# Patient Record
Sex: Female | Born: 1980
Health system: Southern US, Community
[De-identification: ages and names within clinical notes are randomized; demographics above are authoritative.]

## PROBLEM LIST (undated history)

## (undated) ENCOUNTER — Inpatient Hospital Stay (HOSPITAL_COMMUNITY): Payer: Self-pay

## (undated) ENCOUNTER — Inpatient Hospital Stay (HOSPITAL_COMMUNITY): Payer: Self-pay | Admitting: Obstetrics and Gynecology

## (undated) DIAGNOSIS — I10 Essential (primary) hypertension: Secondary | ICD-10-CM

## (undated) DIAGNOSIS — D649 Anemia, unspecified: Secondary | ICD-10-CM

## (undated) DIAGNOSIS — IMO0002 Reserved for concepts with insufficient information to code with codable children: Secondary | ICD-10-CM

## (undated) DIAGNOSIS — N970 Female infertility associated with anovulation: Secondary | ICD-10-CM

## (undated) DIAGNOSIS — G43909 Migraine, unspecified, not intractable, without status migrainosus: Secondary | ICD-10-CM

## (undated) DIAGNOSIS — G510 Bell's palsy: Secondary | ICD-10-CM

## (undated) DIAGNOSIS — R87619 Unspecified abnormal cytological findings in specimens from cervix uteri: Secondary | ICD-10-CM

## (undated) DIAGNOSIS — Z8619 Personal history of other infectious and parasitic diseases: Secondary | ICD-10-CM

## (undated) DIAGNOSIS — O139 Gestational [pregnancy-induced] hypertension without significant proteinuria, unspecified trimester: Secondary | ICD-10-CM

## (undated) HISTORY — PX: COLPOSCOPY: SHX161

## (undated) HISTORY — PX: WISDOM TOOTH EXTRACTION: SHX21

## (undated) HISTORY — DX: Female infertility associated with anovulation: N97.0

## (undated) HISTORY — DX: Personal history of other infectious and parasitic diseases: Z86.19

## (undated) HISTORY — DX: Gestational (pregnancy-induced) hypertension without significant proteinuria, unspecified trimester: O13.9

## (undated) HISTORY — DX: Essential (primary) hypertension: I10

---

## 2004-07-30 ENCOUNTER — Other Ambulatory Visit: Admission: RE | Admit: 2004-07-30 | Discharge: 2004-07-30 | Payer: Self-pay | Admitting: Obstetrics and Gynecology

## 2008-10-31 ENCOUNTER — Inpatient Hospital Stay (HOSPITAL_COMMUNITY): Admission: AD | Admit: 2008-10-31 | Discharge: 2008-10-31 | Payer: Self-pay | Admitting: Obstetrics and Gynecology

## 2008-11-10 ENCOUNTER — Inpatient Hospital Stay (HOSPITAL_COMMUNITY): Admission: AD | Admit: 2008-11-10 | Discharge: 2008-11-13 | Payer: Self-pay | Admitting: Obstetrics and Gynecology

## 2008-11-14 ENCOUNTER — Encounter: Admission: RE | Admit: 2008-11-14 | Discharge: 2008-12-13 | Payer: Self-pay | Admitting: Certified Nurse Midwife

## 2011-09-20 LAB — COMPREHENSIVE METABOLIC PANEL
ALT: 16
ALT: 19
AST: 22
AST: 24
CO2: 23
CO2: 24
Calcium: 9.1
Chloride: 105
Creatinine, Ser: 0.48
GFR calc Af Amer: >60
GFR calc Af Amer: >60
GFR calc non Af Amer: >60
GFR calc non Af Amer: >60
Glucose, Bld: 95
Sodium: 135
Sodium: 138
Total Bilirubin: 0.9
Total Protein: 6

## 2011-09-20 LAB — CBC
HCT: 38.8
Hemoglobin: 12.3
Hemoglobin: 9.1 — ABNORMAL LOW
MCHC: 33.9
MCHC: 34.3
MCHC: 34.3
MCV: 100.6 — ABNORMAL HIGH
MCV: 99.2
MCV: 99.8
Platelets: 210
Platelets: 225
RBC: 2.6 — ABNORMAL LOW
RBC: 3.58 — ABNORMAL LOW
RDW: 13.4
RDW: 13.7
RDW: 13.7
RDW: 14
WBC: 12.3 — ABNORMAL HIGH
WBC: 12.9 — ABNORMAL HIGH
WBC: 13.6 — ABNORMAL HIGH

## 2011-09-20 LAB — LACTATE DEHYDROGENASE
LDH: 173
LDH: 176

## 2012-04-01 ENCOUNTER — Emergency Department (HOSPITAL_COMMUNITY)
Admission: EM | Admit: 2012-04-01 | Discharge: 2012-04-01 | Disposition: A | Discharge status: Home / Self Care | Payer: 59 | Source: Home / Self Care | Attending: Emergency Medicine | Admitting: Emergency Medicine

## 2012-04-01 ENCOUNTER — Encounter (HOSPITAL_COMMUNITY): Payer: Self-pay | Admitting: *Deleted

## 2012-04-01 DIAGNOSIS — J329 Chronic sinusitis, unspecified: Secondary | ICD-10-CM

## 2012-04-01 MED ORDER — ONDANSETRON 8 MG PO TBDP
ORAL_TABLET | ORAL | Status: AC
Start: 2012-04-01 — End: 2012-04-08

## 2012-04-01 MED ORDER — IBUPROFEN 600 MG PO TABS: 600.0000 mg | ORAL_TABLET | Freq: Four times a day (QID) | ORAL | Status: AC | PRN

## 2012-04-01 MED ORDER — FLUTICASONE PROPIONATE 50 MCG/ACT NA SUSP: 2.0000 | Freq: Every day | NASAL | Status: DC

## 2012-04-01 MED ORDER — PSEUDOEPHEDRINE-GUAIFENESIN ER 120-1200 MG PO TB12: 1.0000 | ORAL_TABLET | Freq: Two times a day (BID) | ORAL | Status: DC

## 2012-04-01 NOTE — ED Provider Notes (Signed)
History     CSN: [id number]  Arrival date & time 04/01/12  4098   First MD Initiated Contact with Patient 04/01/12 [phone]      Chief Complaint  Patient presents with  . Fever  . Generalized Body Aches  . Nasal Congestion  . Headache  . Ear Fullness  . Nausea    (Consider location/radiation/quality/duration/timing/severity/associated sxs/prior treatment) HPI Comments: Pt with nasal congestion, postnasal drip,  bilateral ear fullness, bilateral rontal sinus pain/pressure worse with bending forward/lying down x 3 days.  reports fevers Tmax 101.5, bodyaches. States that her upper teeth hurt. Reports nausea, no vomiting. No other sore throat, HA,  purulent nasal d/c. Taking  Tylenol cold sinus with temporary relief. No itchy, watery eyes. Patient's son currently has gastroenteritis, but she states her symptoms are different. No known sick contacts.     Patient is a 31 y.o. female presenting with fever, headaches, and plugged ear sensation.  Fever Primary symptoms of the febrile illness include fever, headaches and nausea. The current episode started 3 to 5 days ago. This is a new problem. The problem has not changed since onset. Headache The primary symptoms include headaches, fever and nausea.  Additional symptoms do not include tinnitus.  Ear Fullness Associated symptoms include headaches.    History reviewed. No pertinent past medical history.  History reviewed. No pertinent past surgical history.  History reviewed. No pertinent family history.  History  Substance Use Topics  . Smoking status: Never Smoker   . Smokeless tobacco: Not on file  . Alcohol Use: No    OB History    Grav Para Term Preterm Abortions TAB SAB Ect Mult Living                  Review of Systems  Constitutional: Positive for fever.  HENT: Positive for ear pain. Negative for sneezing, drooling and tinnitus.   Gastrointestinal: Positive for nausea.  Genitourinary: Negative.   Musculoskeletal:  Negative for back pain.  Neurological: Positive for headaches.    Allergies  Review of patient's allergies indicates no known allergies.  Home Medications   Current Outpatient Rx  Name Route Sig Dispense Refill  . PRESCRIPTION MEDICATION  Birth control pill    . FLUTICASONE PROPIONATE 50 MCG/ACT NA SUSP Nasal Place 2 sprays into the nose daily. 16 g 0  . IBUPROFEN 600 MG PO TABS Oral Take 1 tablet (600 mg total) by mouth every 6 (six) hours as needed for pain. 30 tablet 0  . ONDANSETRON 8 MG PO TBDP  1/2- 1 tablet q 8 hr prn nausea, vomiting 20 tablet 0  . PSEUDOEPHEDRINE-GUAIFENESIN ER [phone] MG PO TB12 Oral Take 1 tablet by mouth 2 (two) times daily. 20 each 0    BP 117/81  Pulse 89  Temp(Src) 99.2 F (37.3 C) (Oral)  Resp 18  SpO2 98%  LMP 03/07/2012  Physical Exam  Nursing note and vitals reviewed. Constitutional: She is oriented to person, place, and time. She appears well-developed and well-nourished.  HENT:  Head: Normocephalic and atraumatic.  Right Ear: Tympanic membrane and ear canal normal.  Left Ear: Tympanic membrane and ear canal normal.  Nose: Mucosal edema and rhinorrhea present. No epistaxis. Right sinus exhibits no frontal sinus tenderness. Left sinus exhibits no frontal sinus tenderness.  Mouth/Throat: Uvula is midline, oropharynx is clear and moist and mucous membranes are normal. Normal dentition. No dental caries.       (+)  maxillary sinus tenderness. No purulent nasal discharge.  Eyes:  Conjunctivae and EOM are normal. Pupils are equal, round, and reactive to light.  Neck: Normal range of motion. Neck supple.  Cardiovascular: Normal rate, regular rhythm and normal heart sounds.   Pulmonary/Chest: Effort normal and breath sounds normal. She has no rales.  Abdominal: Bowel sounds are normal. She exhibits no distension.  Musculoskeletal: Normal range of motion.  Lymphadenopathy:       Shotty cervical lymphadenopathy bilaterally  Neurological: She is  alert and oriented to person, place, and time.  Skin: Skin is warm and dry. No rash noted.  Psychiatric: She has a normal mood and affect. Her behavior is normal. Judgment and thought content normal.    ED Course  Procedures (including critical care time)  Labs Reviewed - No data to display No results found.   1. Sinusitis     MDM  No fevers >102, has had sx for < 10 days, no h/o double sickening. No historical or objective evidence of bacterial infection. No indication for abx. Will start flonase, mucinex-d, increase fluids, nasal saline irrigation,  tylenol/motrin prn pain. Discussed MDM and plan with pt. Pt agrees with plan and will f/u with PMD prn.    Luiz Blare, MD 04/01/12 1028

## 2012-04-01 NOTE — ED Notes (Signed)
Pt with onset of nausea Thursday onset of congestion/fever/bodyaches/headache Friday taking otc tylenol cold

## 2012-04-01 NOTE — Discharge Instructions (Signed)
Take the medication as written. Return if you get worse, have a fever >100.4, or for any concerns. You may take 600 mg of motrin with 1 gram of tylenol up to 4 times a day as needed for pain. This is an effective combination for pain.  Most sinus infections are viral and do not need antibiotics unless you have a high fever, have had this for 10 day, or you get better and then get sick again. Use a neti pot or the NeilMed sinus rinse as often as you want to to reduce nasal congestion. Follow the directions on the box.   Go to www.goodrx.com to look up your medications. This will give you a list of where you can find your prescriptions at the most affordable prices.   

## 2012-12-19 NOTE — L&D Delivery Note (Signed)
Delivery Note  First Stage: Labor onset: 0730 Augmentation : AROM Analgesia /Anesthesia intrapartum: epidural AROM at 1043  Second Stage: Complete dilation at 1126 Onset of pushing at 1130 FHR second stage -CATEGORY 1  Delivery of a viable female at 1134 by CNM in LOA position no nuchal cord Cord double clamped after cessation of pulsation, cut by FOB Cord blood sample collected   Collection of cord blood donation completed    Third Stage: Placenta delivered Duncan intact with 3 VC @ 1146 Placenta disposition: for disposal Uterine tone firm / bleeding moderate - Pitocin per IVF and Cytotec 800 mcg PR for bleeding prophylaxis  no laceration identified   Est. Blood Loss (mL): 400  Complications: none   Mom to postpartum.  Baby to Couplet care / Skin to Skin.  Newborn: Birth Weight: 5-13  Apgar Scores: 9-9 Feeding planned: breast and bottle  Wendy [name] CNM, MSN, [facility] 10/29/2013, 7:36 AM

## 2013-05-08 LAB — OB RESULTS CONSOLE HIV ANTIBODY (ROUTINE TESTING): HIV: NONREACTIVE

## 2013-05-08 LAB — OB RESULTS CONSOLE ABO/RH

## 2013-05-08 LAB — OB RESULTS CONSOLE GC/CHLAMYDIA: Gonorrhea: NEGATIVE

## 2013-05-08 LAB — OB RESULTS CONSOLE ANTIBODY SCREEN: Antibody Screen: NEGATIVE

## 2013-10-06 ENCOUNTER — Encounter (HOSPITAL_COMMUNITY): Payer: Self-pay | Admitting: Obstetrics and Gynecology

## 2013-10-06 ENCOUNTER — Inpatient Hospital Stay (HOSPITAL_COMMUNITY)
Admission: AD | Admit: 2013-10-06 | Discharge: 2013-10-06 | Disposition: A | Discharge status: Home / Self Care | Payer: 59 | Source: Ambulatory Visit | Attending: Obstetrics & Gynecology | Admitting: Obstetrics & Gynecology

## 2013-10-06 DIAGNOSIS — O139 Gestational [pregnancy-induced] hypertension without significant proteinuria, unspecified trimester: Secondary | ICD-10-CM | POA: Insufficient documentation

## 2013-10-06 DIAGNOSIS — O26893 Other specified pregnancy related conditions, third trimester: Secondary | ICD-10-CM

## 2013-10-06 DIAGNOSIS — O133 Gestational [pregnancy-induced] hypertension without significant proteinuria, third trimester: Secondary | ICD-10-CM

## 2013-10-06 DIAGNOSIS — R51 Headache: Secondary | ICD-10-CM | POA: Insufficient documentation

## 2013-10-06 HISTORY — DX: Bell's palsy: G51.0

## 2013-10-06 HISTORY — DX: Unspecified abnormal cytological findings in specimens from cervix uteri: R87.619

## 2013-10-06 HISTORY — DX: Migraine, unspecified, not intractable, without status migrainosus: G43.909

## 2013-10-06 HISTORY — DX: Reserved for concepts with insufficient information to code with codable children: IMO0002

## 2013-10-06 LAB — COMPREHENSIVE METABOLIC PANEL
ALT: 12 U/L (ref 0–35)
Albumin: 2.2 g/dL — ABNORMAL LOW (ref 3.5–5.2)
Alkaline Phosphatase: 203 U/L — ABNORMAL HIGH (ref 39–117)
Glucose, Bld: 90 mg/dL (ref 70–99)
Potassium: 4.2 mEq/L (ref 3.5–5.1)
Sodium: 137 mEq/L (ref 135–145)
Total Protein: 5.6 g/dL — ABNORMAL LOW (ref 6.0–8.3)

## 2013-10-06 LAB — CBC
Hemoglobin: 9.6 g/dL — ABNORMAL LOW (ref 12.0–15.0)
MCV: 89.5 fL (ref 78.0–100.0)
Platelets: 178 10*3/uL (ref 150–400)
RBC: 3.23 MIL/uL — ABNORMAL LOW (ref 3.87–5.11)
WBC: 8.8 10*3/uL (ref 4.0–10.5)

## 2013-10-06 LAB — URINALYSIS, ROUTINE W REFLEX MICROSCOPIC
Hgb urine dipstick: NEGATIVE
Specific Gravity, Urine: <1.005 — ABNORMAL LOW (ref 1.005–1.030)
Urobilinogen, UA: 0.2 mg/dL (ref 0.0–1.0)
pH: 7 (ref 5.0–8.0)

## 2013-10-06 LAB — URIC ACID: Uric Acid, Serum: 4.4 mg/dL (ref 2.4–7.0)

## 2013-10-06 MED ORDER — LACTATED RINGERS IV SOLN
INTRAVENOUS | Status: DC
  Administered 2013-10-06: 06:00:00 via INTRAVENOUS

## 2013-10-06 MED ORDER — ONDANSETRON HCL 4 MG/2ML IJ SOLN
4.0000 mg | Freq: Once | INTRAMUSCULAR | Status: AC
  Administered 2013-10-06: 4 mg via INTRAVENOUS
  Filled 2013-10-06: qty 2

## 2013-10-06 MED ORDER — LABETALOL HCL 100 MG PO TABS: 100.0000 mg | ORAL_TABLET | Freq: Three times a day (TID) | ORAL | Status: DC

## 2013-10-06 MED ORDER — BUTORPHANOL TARTRATE 1 MG/ML IJ SOLN
1.0000 mg | Freq: Once | INTRAMUSCULAR | Status: AC
  Administered 2013-10-06: 1 mg via INTRAVENOUS
  Filled 2013-10-06: qty 1

## 2013-10-06 NOTE — MAU Provider Note (Signed)
Reviewed and agree with note and plan. V.Landon Truax, MD  

## 2013-10-06 NOTE — Progress Notes (Signed)
Raelyn Mora CNM called unit with orders for pt. Made aware pt here. Orders received and CNM will see pt

## 2013-10-06 NOTE — MAU Provider Note (Signed)
History     CSN: [id number]  Arrival date and time: 10/06/13 0413 Provider on unit: 0450 Provider notified: [phone] Provider at bedside: 0459    Chief Complaint  Patient presents with  . Headache  . Hypertension  . Leg Swelling   HPI  Wendy [name] is a 32 y.o. G11P1001 female at 2.[redacted]wks gestation presenting with a headache x 2 days with minimal relief from Tylenol,  Elevated BPs and swelling in LE.  She reports BPs at home ranging 140s/80s, 4 lb weight gain since Tuesday.  Decreased FM.  She denies Dizziness, blurry vision, RUQ abd pain, VB, or LOF.  She has a h/o migraine headaches and elevated BPs with swelling late in third trimester with her first pregnancy.  Past Medical History  Diagnosis Date  . Migraine   . Bell's palsy   . Abnormal Pap smear    History reviewed. No pertinent past surgical history.  History reviewed. No pertinent family history.   History  Substance Use Topics  . Smoking status: Never Smoker   . Smokeless tobacco: Not on file  . Alcohol Use: No    Allergies: No Known Allergies  Prescriptions prior to admission  Medication Sig Dispense Refill  . fluticasone (FLONASE) 50 MCG/ACT nasal spray Place 2 sprays into the nose daily.  16 g  0  . PRESCRIPTION MEDICATION Birth control pill      . Pseudoephedrine-Guaifenesin (MUCINEX D) [phone] MG TB12 Take 1 tablet by mouth 2 (two) times daily.  20 each  0   Results for orders placed during the hospital encounter of 10/06/13 (from the past 24 hour(s))  URINALYSIS, ROUTINE W REFLEX MICROSCOPIC     Status: Abnormal   Collection Time    10/06/13  4:10 AM      Result Value Range   Color, Urine YELLOW  YELLOW   APPearance CLEAR  CLEAR   Specific Gravity, Urine <1.005 (*) 1.005 - 1.030   pH 7.0  5.0 - 8.0   Glucose, UA NEGATIVE  NEGATIVE mg/dL   Hgb urine dipstick NEGATIVE  NEGATIVE   Bilirubin Urine NEGATIVE  NEGATIVE   Ketones, ur NEGATIVE  NEGATIVE mg/dL   Protein, ur NEGATIVE  NEGATIVE  mg/dL   Urobilinogen, UA 0.2  0.0 - 1.0 mg/dL   Nitrite NEGATIVE  NEGATIVE   Leukocytes, UA NEGATIVE  NEGATIVE  CBC     Status: Abnormal   Collection Time    10/06/13  5:25 AM      Result Value Range   WBC 8.8  4.0 - 10.5 K/uL   RBC 3.23 (*) 3.87 - 5.11 MIL/uL   Hemoglobin 9.6 (*) 12.0 - 15.0 g/dL   HCT 66.4 (*) 40.3 - 47.4 %   MCV 89.5  78.0 - 100.0 fL   MCH 29.7  26.0 - 34.0 pg   MCHC 33.2  30.0 - 36.0 g/dL   RDW 25.9  56.3 - 87.5 %   Platelets 178  150 - 400 K/uL  URIC ACID     Status: None   Collection Time    10/06/13  5:25 AM      Result Value Range   Uric Acid, Serum 4.4  2.4 - 7.0 mg/dL  LACTATE DEHYDROGENASE     Status: None   Collection Time    10/06/13  5:25 AM      Result Value Range   LDH 188  94 - 250 U/L  COMPREHENSIVE METABOLIC PANEL     Status: Abnormal  Collection Time    10/06/13  5:25 AM      Result Value Range   Sodium 137  135 - 145 mEq/L   Potassium 4.2  3.5 - 5.1 mEq/L   Chloride 105  96 - 112 mEq/L   CO2 22  19 - 32 mEq/L   Glucose, Bld 90  70 - 99 mg/dL   BUN 10  6 - 23 mg/dL   Creatinine, Ser 1.61  0.50 - 1.10 mg/dL   Calcium 8.7  8.4 - 09.6 mg/dL   Total Protein 5.6 (*) 6.0 - 8.3 g/dL   Albumin 2.2 (*) 3.5 - 5.2 g/dL   AST 21  0 - 37 U/L   ALT 12  0 - 35 U/L   Alkaline Phosphatase 203 (*) 39 - 117 U/L   Total Bilirubin 0.3  0.3 - 1.2 mg/dL   GFR calc non Af Amer >90  >90 mL/min   GFR calc Af Amer >90  >90 mL/min    Review of Systems  Constitutional: Negative.   Eyes: Negative.   Respiratory: Negative.   Cardiovascular: Negative.   Gastrointestinal: Negative.   Genitourinary: Negative.        (+) FM, decreased  Musculoskeletal: Negative.   Skin: Negative.   Neurological: Positive for headaches.  Endo/Heme/Allergies: Negative.   Psychiatric/Behavioral: Negative.    FHR: 130, moderate variability, accelerations present, no decelerations TOCO: no UC's  Physical Exam   Blood pressure 154/91, pulse 78, temperature 98 F (36.7  C), temperature source Oral, resp. rate 18, height 5\' 1"  (1.549 m), weight 86.637 kg (191 lb). BP: 152/94, 148/86, 154/85, 147/75, 148/92 Physical Exam  Constitutional: She is oriented to person, place, and time. She appears well-developed and well-nourished.  HENT:  Head: Normocephalic.  Eyes: Pupils are equal, round, and reactive to light.  Neck: Normal range of motion. Neck supple.  Cardiovascular: Normal rate, regular rhythm, normal heart sounds and intact distal pulses.   Respiratory: Effort normal and breath sounds normal.  GI: Soft. Bowel sounds are normal.  Genitourinary: Uterus normal.  Gravid uterus, soft, non-tender  Musculoskeletal: Normal range of motion.  Neurological: She is alert and oriented to person, place, and time. She has normal reflexes.  Skin: Skin is warm and dry.  Psychiatric: She has a normal mood and affect. Her behavior is normal. Judgment and thought content normal.    MAU Course  Procedures NST CBC CMP Uric acid LDH LR 500 mL bolus then 500 mL/hr Weight Serial BPs  Assessment and Plan  32 y.o. G2P1001, IUP @ 34.[redacted]wks gestation Headache Gestational Hypertension Dependent Edema  Category 1 FHR tracing PIH labs WNL Start Labetalol 100 mg TID 24 hr urine collection - return to office tomorrow BP check in office tomorrow Continue increase hydration and rest  *Dr. Juliene Pina consulted - recommendation to start Labetalol 100 mg TID  Raelyn Mora, M, MSN, CNM 10/06/2013, 5:05 AM

## 2013-10-06 NOTE — MAU Note (Signed)
Patient is in with c/o constant headache without relief from 1gram tylenol, every 6 hours (last dose at 0200am). She also states that her blood pressure is elevated 150's/90's with swelling. Patient denies visual disturbance, epigastric pain.. 2+ reflexes, no clonus. 2+ edema on left foot. Patient denies contractions, vaginal bleeding or lof.

## 2013-10-21 LAB — OB RESULTS CONSOLE GBS: GBS: POSITIVE

## 2013-10-24 ENCOUNTER — Encounter (HOSPITAL_COMMUNITY): Payer: Self-pay | Admitting: *Deleted

## 2013-10-24 ENCOUNTER — Observation Stay (HOSPITAL_COMMUNITY)
Admission: AD | Admit: 2013-10-24 | Discharge: 2013-10-24 | Disposition: A | Discharge status: Home / Self Care | Payer: 59 | Source: Ambulatory Visit | Attending: Obstetrics and Gynecology | Admitting: Obstetrics and Gynecology

## 2013-10-24 DIAGNOSIS — O139 Gestational [pregnancy-induced] hypertension without significant proteinuria, unspecified trimester: Principal | ICD-10-CM | POA: Insufficient documentation

## 2013-10-24 LAB — COMPREHENSIVE METABOLIC PANEL
ALT: 13 U/L (ref 0–35)
AST: 23 U/L (ref 0–37)
Albumin: 2.6 g/dL — ABNORMAL LOW (ref 3.5–5.2)
CO2: 23 mEq/L (ref 19–32)
Calcium: 8.8 mg/dL (ref 8.4–10.5)
Chloride: 102 mEq/L (ref 96–112)
GFR calc non Af Amer: >90 mL/min (ref >90)
Sodium: 135 mEq/L (ref 135–145)
Total Bilirubin: 0.5 mg/dL (ref 0.3–1.2)

## 2013-10-24 LAB — CBC
Hemoglobin: 9.3 g/dL — ABNORMAL LOW (ref 12.0–15.0)
MCHC: 33.1 g/dL (ref 30.0–36.0)
MCV: 89.2 fL (ref 78.0–100.0)
Platelets: 216 10*3/uL (ref 150–400)
RBC: 3.15 MIL/uL — ABNORMAL LOW (ref 3.87–5.11)
WBC: 9.4 10*3/uL (ref 4.0–10.5)

## 2013-10-24 LAB — PROTEIN / CREATININE RATIO, URINE
Creatinine, Urine: 21.27 mg/dL
Total Protein, Urine: <4 mg/dL

## 2013-10-24 LAB — LACTATE DEHYDROGENASE: LDH: 227 U/L (ref 94–250)

## 2013-10-24 NOTE — Progress Notes (Signed)
Wendy [name] is a 32 y.o. G2P1001 at [redacted]w[redacted]d by LMP admitted for observation of serial BP and labs. History of Gestational HTN on Labetalol now with exacerbation during office visit today. No s/s of PEC. No occipital headache, or epigastric pain. No visual symptoms. Good FM. History of AGA and nl AFI with bpp 10/10 today.   Subjective: See above  Objective: Pulse Rate:  [67-85] 69 (11/06 2001) Resp:  [18] 18 (11/06 2001) BP: (115-178)/(68-97) 115/68 mmHg (11/06 2001) Weight:  [87.091 kg (192 lb)] 87.091 kg (192 lb) (11/06 1808)  CBC    Component Value Date/Time   WBC 9.4 10/24/2013 1828   RBC 3.15* 10/24/2013 1828   HGB 9.3* 10/24/2013 1828   HCT 28.1* 10/24/2013 1828   PLT 216 10/24/2013 1828   MCV 89.2 10/24/2013 1828   MCH 29.5 10/24/2013 1828   MCHC 33.1 10/24/2013 1828   RDW 13.9 10/24/2013 1828   CMP     Component Value Date/Time   NA 135 10/24/2013 1828   K 4.2 10/24/2013 1828   CL 102 10/24/2013 1828   CO2 23 10/24/2013 1828   GLUCOSE 80 10/24/2013 1828   BUN 10 10/24/2013 1828   CREATININE 0.54 10/24/2013 1828   CALCIUM 8.8 10/24/2013 1828   PROT 6.1 10/24/2013 1828   ALBUMIN 2.6* 10/24/2013 1828   AST 23 10/24/2013 1828   ALT 13 10/24/2013 1828   ALKPHOS 254* 10/24/2013 1828   BILITOT 0.5 10/24/2013 1828   GFRNONAA >90 10/24/2013 1828   GFRAA >90 10/24/2013 1828   FHT:  FHR: 145-155 bpm, variability: moderate,  accelerations:  Present,  decelerations:  Absent    Assessment / Plan: 36 + weeks Gestational HTN - labile but improved . Nl labs  Labor: induction scheduled Preeclampsia:  no signs or symptoms of toxicity and labs stable Fetal Wellbeing:  Category I Pain Control:  na I/D:  n/a Anticipated MOD:  DC home with PIH precautions  Keldon Lassen J 10/24/2013, 5:54 PM

## 2013-10-25 ENCOUNTER — Encounter (HOSPITAL_COMMUNITY): Payer: Self-pay | Admitting: *Deleted

## 2013-10-25 ENCOUNTER — Telehealth (HOSPITAL_COMMUNITY): Payer: Self-pay | Admitting: *Deleted

## 2013-10-25 NOTE — Telephone Encounter (Signed)
Preadmission screen  

## 2013-10-27 ENCOUNTER — Inpatient Hospital Stay (HOSPITAL_COMMUNITY)
Admission: RE | Admit: 2013-10-27 | Discharge: 2013-10-30 | DRG: 775 | Disposition: A | Discharge status: Home / Self Care | Payer: 59 | Source: Ambulatory Visit | Attending: Obstetrics and Gynecology | Admitting: Obstetrics and Gynecology

## 2013-10-27 ENCOUNTER — Encounter (HOSPITAL_COMMUNITY): Payer: Self-pay

## 2013-10-27 ENCOUNTER — Inpatient Hospital Stay (HOSPITAL_COMMUNITY): Admit: 2013-10-27 | Payer: 59 | Admitting: Obstetrics and Gynecology

## 2013-10-27 DIAGNOSIS — R609 Edema, unspecified: Secondary | ICD-10-CM

## 2013-10-27 DIAGNOSIS — O9903 Anemia complicating the puerperium: Secondary | ICD-10-CM | POA: Diagnosis not present

## 2013-10-27 DIAGNOSIS — O133 Gestational [pregnancy-induced] hypertension without significant proteinuria, third trimester: Secondary | ICD-10-CM

## 2013-10-27 DIAGNOSIS — O139 Gestational [pregnancy-induced] hypertension without significant proteinuria, unspecified trimester: Principal | ICD-10-CM | POA: Diagnosis present

## 2013-10-27 DIAGNOSIS — IMO0002 Reserved for concepts with insufficient information to code with codable children: Secondary | ICD-10-CM | POA: Diagnosis present

## 2013-10-27 DIAGNOSIS — D62 Acute posthemorrhagic anemia: Secondary | ICD-10-CM | POA: Diagnosis not present

## 2013-10-27 HISTORY — DX: Anemia, unspecified: D64.9

## 2013-10-27 LAB — COMPREHENSIVE METABOLIC PANEL WITH GFR
ALT: 15 U/L (ref 0–35)
AST: 27 U/L (ref 0–37)
Albumin: 2.5 g/dL — ABNORMAL LOW (ref 3.5–5.2)
Alkaline Phosphatase: 270 U/L — ABNORMAL HIGH (ref 39–117)
BUN: 10 mg/dL (ref 6–23)
CO2: 22 meq/L (ref 19–32)
Calcium: 8.4 mg/dL (ref 8.4–10.5)
Chloride: 103 meq/L (ref 96–112)
Creatinine, Ser: 0.55 mg/dL (ref 0.50–1.10)
GFR calc Af Amer: >90 mL/min (ref >90)
GFR calc non Af Amer: >90 mL/min (ref >90)
Glucose, Bld: 82 mg/dL (ref 70–99)
Potassium: 4 meq/L (ref 3.5–5.1)
Sodium: 134 meq/L — ABNORMAL LOW (ref 135–145)
Total Bilirubin: 0.4 mg/dL (ref 0.3–1.2)
Total Protein: 5.8 g/dL — ABNORMAL LOW (ref 6.0–8.3)

## 2013-10-27 LAB — CBC
HCT: 28 % — ABNORMAL LOW (ref 36.0–46.0)
MCH: 29.1 pg (ref 26.0–34.0)
MCHC: 31.8 g/dL (ref 30.0–36.0)
MCV: 91.5 fL (ref 78.0–100.0)
Platelets: 222 10*3/uL (ref 150–400)
RBC: 3.06 MIL/uL — ABNORMAL LOW (ref 3.87–5.11)
RDW: 14 % (ref 11.5–15.5)

## 2013-10-27 LAB — TYPE AND SCREEN
ABO/RH(D): A POS
Antibody Screen: NEGATIVE

## 2013-10-27 LAB — LACTATE DEHYDROGENASE: LDH: 250 U/L (ref 94–250)

## 2013-10-27 LAB — SYPHILIS: RPR W/REFLEX TO RPR TITER AND TREPONEMAL ANTIBODIES, TRADITIONAL SCREENING AND DIAGNOSIS ALGORITHM: RPR Ser Ql: NONREACTIVE

## 2013-10-27 LAB — URIC ACID: Uric Acid, Serum: 5.8 mg/dL (ref 2.4–7.0)

## 2013-10-27 MED ORDER — BUTORPHANOL TARTRATE 1 MG/ML IJ SOLN: 1.0000 mg | INTRAMUSCULAR | Status: DC | PRN

## 2013-10-27 MED ORDER — ONDANSETRON HCL 4 MG/2ML IJ SOLN: 4.0000 mg | Freq: Four times a day (QID) | INTRAMUSCULAR | Status: DC | PRN

## 2013-10-27 MED ORDER — CITRIC ACID-SODIUM CITRATE 334-500 MG/5ML PO SOLN: 30.0000 mL | ORAL | Status: DC | PRN

## 2013-10-27 MED ORDER — OXYTOCIN 40 UNITS IN LACTATED RINGERS INFUSION - SIMPLE MED: 1.0000 m[IU]/min | INTRAVENOUS | Status: DC

## 2013-10-27 MED ORDER — LACTATED RINGERS IV SOLN: 500.0000 mL | INTRAVENOUS | Status: DC | PRN

## 2013-10-27 MED ORDER — ZOLPIDEM TARTRATE 5 MG PO TABS: 5.0000 mg | ORAL_TABLET | Freq: Every evening | ORAL | Status: DC | PRN

## 2013-10-27 MED ORDER — TERBUTALINE SULFATE 1 MG/ML IJ SOLN: 0.2500 mg | Freq: Once | INTRAMUSCULAR | Status: AC | PRN

## 2013-10-27 MED ORDER — LIDOCAINE HCL (PF) 1 % IJ SOLN
30.0000 mL | INTRAMUSCULAR | Status: DC | PRN
  Filled 2013-10-27: qty 30

## 2013-10-27 MED ORDER — PENICILLIN G POTASSIUM 5000000 UNITS IJ SOLR
5.0000 10*6.[IU] | Freq: Once | INTRAVENOUS | Status: DC
  Filled 2013-10-27: qty 5

## 2013-10-27 MED ORDER — OXYTOCIN 40 UNITS IN LACTATED RINGERS INFUSION - SIMPLE MED: 62.5000 mL/h | INTRAVENOUS | Status: DC

## 2013-10-27 MED ORDER — OXYTOCIN BOLUS FROM INFUSION: 500.0000 mL | INTRAVENOUS | Status: DC

## 2013-10-27 MED ORDER — MISOPROSTOL 25 MCG QUARTER TABLET
25.0000 ug | ORAL_TABLET | ORAL | Status: DC | PRN
  Administered 2013-10-27 – 2013-10-28 (×2): 25 ug via VAGINAL
  Filled 2013-10-27 (×2): qty 0.25

## 2013-10-27 MED ORDER — IBUPROFEN 600 MG PO TABS
600.0000 mg | ORAL_TABLET | Freq: Four times a day (QID) | ORAL | Status: DC | PRN
  Administered 2013-10-28: 600 mg via ORAL
  Filled 2013-10-27: qty 1

## 2013-10-27 MED ORDER — OXYCODONE-ACETAMINOPHEN 5-325 MG PO TABS: 1.0000 | ORAL_TABLET | ORAL | Status: DC | PRN

## 2013-10-27 MED ORDER — PENICILLIN G POTASSIUM 5000000 UNITS IJ SOLR
2.5000 10*6.[IU] | INTRAVENOUS | Status: DC
  Filled 2013-10-27 (×2): qty 2.5

## 2013-10-27 MED ORDER — FLEET ENEMA 7-19 GM/118ML RE ENEM: 1.0000 | ENEMA | RECTAL | Status: DC | PRN

## 2013-10-27 MED ORDER — ACETAMINOPHEN 325 MG PO TABS: 650.0000 mg | ORAL_TABLET | ORAL | Status: DC | PRN

## 2013-10-27 MED ORDER — LACTATED RINGERS IV SOLN
INTRAVENOUS | Status: DC
  Administered 2013-10-27 – 2013-10-28 (×2): via INTRAVENOUS

## 2013-10-27 NOTE — H&P (Signed)
Wendy [name], Wendy [name]              ACCOUNT NO.:  [id number]  MEDICAL RECORD NO.:  [id number]  LOCATION:  9162                          FACILITY:  [facility]  PHYSICIAN:  Lenoard Aden, M.D.DATE OF BIRTH:  10/07/1981  DATE OF ADMISSION:  10/27/2013 DATE OF DISCHARGE:                             HISTORY & PHYSICAL   CHIEF COMPLAINT:  Labile gestational hypertension, for cervical ripening and induction.  HISTORY OF PRESENT ILLNESS:  She is a 32 year old white female, G2, P1, at 59 and [redacted] weeks gestational age, who presents with labile gestational hypertension for an induction.  ALLERGIES:  She has no known drug allergies.  MEDICATIONS:  Prenatal vitamins, labetalol, Zantac.  She has a personal history of migraine headaches, chickenpox, abnormal Pap smear.  FAMILY HISTORY:  Hypertension, diabetes, heart disease, kidney disease, stroke.  PAST MEDICAL HISTORY:  Previous history of vaginal delivery x1 at 39 weeks.  Prenatal course complicated by labile gestational hypertension, currently on labetalol.  She has had normal PIH labs.  However, blood pressures continued to be labile on escalating doses of labetalol. Decision was made due to these labile blood pressures to proceed with induction.  Ultrasound has revealed appropriate fetal surveillance with an appropriate for gestational age fetus on most recent ultrasound of approximately 6.5 pounds.  PHYSICAL EXAMINATION:  GENERAL:  She is a well-developed, well-nourished white female, in no acute distress. HEENT:  Normal. NECK:  Supple.  Full range of motion. LUNGS:  Clear. HEART:  Regular rate and rhythm. ABDOMEN:  Soft, gravid, nontender.  Fetal weight by ultrasound 6.5-7 pounds.  Cervix is 1 cm, 50%, vertex, -3. EXTREMITIES:  No cords. NEUROLOGIC:  Nonfocal. SKIN:  Intact.  IMPRESSION: 1. A 37-week intrauterine pregnancy. 2. Labile gestational hypertension.  No signs and symptoms of     preeclampsia.  Labs stable.  PLAN:   Proceed with cervical ripening and induction.  Risks and benefits discussed.  Monitor signs and symptoms of preeclampsia.  Serial labs. Continue labetalol.  Anticipate cautious attempts at vaginal delivery. Cytotec placed.     Lenoard Aden, M.D.     RJT/MEDQ  D:  10/27/2013  T:  10/27/2013  Job:  [id number]

## 2013-10-27 NOTE — Progress Notes (Signed)
Wendy [name] is a 32 y.o. G2P1001 at [redacted]w[redacted]d by LMP admitted for induction of labor due to Hypertension gestational and labile on Labetalol  Subjective: No complaints  Objective: BP 134/76  Pulse 74  Temp(Src) 98 F (36.7 C) (Oral)  Resp 18  Temp(Src) 98 F (36.7 C) (Oral)  Resp 18      FHT:  FHR: 125 bpm, variability: moderate,  accelerations:  Present,  decelerations:  Absent UC:   none SVE: 1/50/-3 cytotec to be placed     Labs: Lab Results  Component Value Date   WBC 9.4 10/24/2013   HGB 9.3* 10/24/2013   HCT 28.1* 10/24/2013   MCV 89.2 10/24/2013   PLT 216 10/24/2013    Assessment / Plan: 37 week IUP AGA Labile Gestational HTN   Labor: Progressing normally Preeclampsia:  no signs or symptoms of toxicity, intake and ouput balanced and labs stable Fetal Wellbeing:  Category I Pain Control:  Labor support without medications I/D:  n/a Anticipated MOD:  NSVD  Wendy [name] 10/27/2013, 8:34 PM

## 2013-10-28 ENCOUNTER — Encounter (HOSPITAL_COMMUNITY): Payer: Self-pay

## 2013-10-28 ENCOUNTER — Encounter (HOSPITAL_COMMUNITY): Payer: 59 | Admitting: Anesthesiology

## 2013-10-28 ENCOUNTER — Inpatient Hospital Stay (HOSPITAL_COMMUNITY): Payer: 59 | Admitting: Anesthesiology

## 2013-10-28 DIAGNOSIS — R609 Edema, unspecified: Secondary | ICD-10-CM | POA: Diagnosis present

## 2013-10-28 DIAGNOSIS — O139 Gestational [pregnancy-induced] hypertension without significant proteinuria, unspecified trimester: Secondary | ICD-10-CM | POA: Diagnosis present

## 2013-10-28 LAB — CBC
HCT: 27.9 % — ABNORMAL LOW (ref 36.0–46.0)
HCT: 25.6 % — ABNORMAL LOW (ref 36.0–46.0)
Hemoglobin: 9.2 g/dL — ABNORMAL LOW (ref 12.0–15.0)
Hemoglobin: 8.5 g/dL — ABNORMAL LOW (ref 12.0–15.0)
MCH: 29.1 pg (ref 26.0–34.0)
MCH: 29.4 pg (ref 26.0–34.0)
MCHC: 33 g/dL (ref 30.0–36.0)
MCHC: 33.2 g/dL (ref 30.0–36.0)
MCV: 88.3 fL (ref 78.0–100.0)
MCV: 88.6 fL (ref 78.0–100.0)
Platelets: 219 10*3/uL (ref 150–400)
RBC: 3.16 MIL/uL — ABNORMAL LOW (ref 3.87–5.11)
RDW: 14.1 % (ref 11.5–15.5)
RDW: 14 % (ref 11.5–15.5)
WBC: 8.7 10*3/uL (ref 4.0–10.5)

## 2013-10-28 LAB — ABO/RH: ABO/RH(D): A POS

## 2013-10-28 MED ORDER — BENZOCAINE-MENTHOL 20-0.5 % EX AERO: 1.0000 "application " | INHALATION_SPRAY | CUTANEOUS | Status: DC | PRN

## 2013-10-28 MED ORDER — LACTATED RINGERS IV SOLN: 500.0000 mL | Freq: Once | INTRAVENOUS | Status: DC

## 2013-10-28 MED ORDER — FENTANYL 2.5 MCG/ML BUPIVACAINE 1/10 % EPIDURAL INFUSION (WH - ANES)
14.0000 mL/h | INTRAMUSCULAR | Status: DC | PRN
  Filled 2013-10-28: qty 125

## 2013-10-28 MED ORDER — PENICILLIN G POTASSIUM 5000000 UNITS IJ SOLR: 2.5000 10*6.[IU] | INTRAVENOUS | Status: DC

## 2013-10-28 MED ORDER — FAMOTIDINE 20 MG PO TABS
20.0000 mg | ORAL_TABLET | Freq: Every day | ORAL | Status: DC
  Administered 2013-10-28 – 2013-10-30 (×3): 20 mg via ORAL
  Filled 2013-10-28 (×3): qty 1

## 2013-10-28 MED ORDER — PHENYLEPHRINE 40 MCG/ML (10ML) SYRINGE FOR IV PUSH (FOR BLOOD PRESSURE SUPPORT)
80.0000 ug | PREFILLED_SYRINGE | INTRAVENOUS | Status: DC | PRN
  Filled 2013-10-28: qty 10
  Filled 2013-10-28: qty 2

## 2013-10-28 MED ORDER — IBUPROFEN 800 MG PO TABS
800.0000 mg | ORAL_TABLET | Freq: Three times a day (TID) | ORAL | Status: DC
  Administered 2013-10-28 – 2013-10-30 (×5): 800 mg via ORAL
  Filled 2013-10-28 (×5): qty 1

## 2013-10-28 MED ORDER — LANOLIN HYDROUS EX OINT: TOPICAL_OINTMENT | CUTANEOUS | Status: DC | PRN

## 2013-10-28 MED ORDER — PENICILLIN G POTASSIUM 5000000 UNITS IJ SOLR
2.5000 10*6.[IU] | INTRAVENOUS | Status: DC
  Administered 2013-10-28: 2.5 10*6.[IU] via INTRAVENOUS
  Filled 2013-10-28 (×5): qty 2.5

## 2013-10-28 MED ORDER — PENICILLIN G POTASSIUM 5000000 UNITS IJ SOLR: 5.0000 10*6.[IU] | Freq: Once | INTRAVENOUS | Status: DC

## 2013-10-28 MED ORDER — DIPHENHYDRAMINE HCL 50 MG/ML IJ SOLN: 12.5000 mg | INTRAMUSCULAR | Status: DC | PRN

## 2013-10-28 MED ORDER — SIMETHICONE 80 MG PO CHEW: 80.0000 mg | CHEWABLE_TABLET | ORAL | Status: DC | PRN

## 2013-10-28 MED ORDER — PRENATAL MULTIVITAMIN CH
1.0000 | ORAL_TABLET | Freq: Every day | ORAL | Status: DC
  Administered 2013-10-29: 1 via ORAL
  Filled 2013-10-28: qty 1

## 2013-10-28 MED ORDER — OXYTOCIN 40 UNITS IN LACTATED RINGERS INFUSION - SIMPLE MED
1.0000 m[IU]/min | INTRAVENOUS | Status: DC
  Administered 2013-10-28: 666 m[IU]/min via INTRAVENOUS
  Administered 2013-10-28: 2 m[IU]/min via INTRAVENOUS
  Filled 2013-10-28: qty 1000

## 2013-10-28 MED ORDER — DIPHENHYDRAMINE HCL 25 MG PO CAPS: 25.0000 mg | ORAL_CAPSULE | Freq: Four times a day (QID) | ORAL | Status: DC | PRN

## 2013-10-28 MED ORDER — MISOPROSTOL 200 MCG PO TABS
800.0000 ug | ORAL_TABLET | Freq: Once | ORAL | Status: AC
  Administered 2013-10-28: 800 ug via RECTAL

## 2013-10-28 MED ORDER — HYDROCHLOROTHIAZIDE 25 MG PO TABS
25.0000 mg | ORAL_TABLET | Freq: Every day | ORAL | Status: DC
  Administered 2013-10-28 – 2013-10-30 (×3): 25 mg via ORAL
  Filled 2013-10-28 (×3): qty 1

## 2013-10-28 MED ORDER — EPHEDRINE 5 MG/ML INJ
10.0000 mg | INTRAVENOUS | Status: DC | PRN
  Filled 2013-10-28: qty 2
  Filled 2013-10-28: qty 4

## 2013-10-28 MED ORDER — LIDOCAINE HCL (PF) 1 % IJ SOLN
INTRAMUSCULAR | Status: DC | PRN
  Administered 2013-10-28 (×2): 4 mL

## 2013-10-28 MED ORDER — EPHEDRINE 5 MG/ML INJ
10.0000 mg | INTRAVENOUS | Status: DC | PRN
  Filled 2013-10-28: qty 2

## 2013-10-28 MED ORDER — MISOPROSTOL 200 MCG PO TABS
ORAL_TABLET | ORAL | Status: AC
  Administered 2013-10-28: 800 ug via RECTAL
  Filled 2013-10-28: qty 4

## 2013-10-28 MED ORDER — POLYSACCHARIDE IRON COMPLEX 150 MG PO CAPS
150.0000 mg | ORAL_CAPSULE | Freq: Every day | ORAL | Status: DC
  Administered 2013-10-28 – 2013-10-30 (×3): 150 mg via ORAL
  Filled 2013-10-28 (×3): qty 1

## 2013-10-28 MED ORDER — PENICILLIN G POTASSIUM 5000000 UNITS IJ SOLR
5.0000 10*6.[IU] | Freq: Once | INTRAVENOUS | Status: AC
  Administered 2013-10-28: 5 10*6.[IU] via INTRAVENOUS
  Filled 2013-10-28: qty 5

## 2013-10-28 MED ORDER — LABETALOL HCL 200 MG PO TABS
200.0000 mg | ORAL_TABLET | Freq: Three times a day (TID) | ORAL | Status: DC
  Administered 2013-10-28: 200 mg via ORAL
  Filled 2013-10-28 (×4): qty 1

## 2013-10-28 MED ORDER — FENTANYL 2.5 MCG/ML BUPIVACAINE 1/10 % EPIDURAL INFUSION (WH - ANES)
INTRAMUSCULAR | Status: DC | PRN
  Administered 2013-10-28: 12 mL/h via EPIDURAL

## 2013-10-28 MED ORDER — LABETALOL HCL 200 MG PO TABS
200.0000 mg | ORAL_TABLET | Freq: Three times a day (TID) | ORAL | Status: DC
  Administered 2013-10-28 – 2013-10-30 (×6): 200 mg via ORAL
  Filled 2013-10-28 (×7): qty 1

## 2013-10-28 MED ORDER — SENNOSIDES-DOCUSATE SODIUM 8.6-50 MG PO TABS
2.0000 | ORAL_TABLET | ORAL | Status: DC
  Administered 2013-10-28 – 2013-10-30 (×2): 2 via ORAL
  Filled 2013-10-28 (×2): qty 2

## 2013-10-28 MED ORDER — PHENYLEPHRINE 40 MCG/ML (10ML) SYRINGE FOR IV PUSH (FOR BLOOD PRESSURE SUPPORT)
80.0000 ug | PREFILLED_SYRINGE | INTRAVENOUS | Status: DC | PRN
  Filled 2013-10-28: qty 2

## 2013-10-28 MED ORDER — OXYCODONE-ACETAMINOPHEN 5-325 MG PO TABS: 1.0000 | ORAL_TABLET | ORAL | Status: DC | PRN

## 2013-10-28 MED ORDER — MAGNESIUM OXIDE 400 (241.3 MG) MG PO TABS
400.0000 mg | ORAL_TABLET | Freq: Every day | ORAL | Status: DC
  Administered 2013-10-28 – 2013-10-30 (×3): 400 mg via ORAL
  Filled 2013-10-28 (×3): qty 1

## 2013-10-28 NOTE — Anesthesia Procedure Notes (Signed)
Epidural Patient location during procedure: OB Start time: 10/28/2013 8:22 AM  Staffing Anesthesiologist: Dulcemaria Bula A. Performed by: anesthesiologist   Preanesthetic Checklist Completed: patient identified, site marked, surgical consent, pre-op evaluation, timeout performed, IV checked, risks and benefits discussed and monitors and equipment checked  Epidural Patient position: sitting Prep: site prepped and draped and DuraPrep Patient monitoring: continuous pulse ox and blood pressure Approach: midline Injection technique: LOR air  Needle:  Needle type: Tuohy  Needle gauge: 17 G Needle length: 9 cm and 9 Needle insertion depth: 7 cm Catheter type: closed end flexible Catheter size: 19 Gauge Catheter at skin depth: 12 cm Test dose: negative and Other  Assessment Events: blood not aspirated, injection not painful, no injection resistance, negative IV test and no paresthesia  Additional Notes Patient identified. Risks and benefits discussed including failed block, incomplete  Pain control, post dural puncture headache, nerve damage, paralysis, blood pressure Changes, nausea, vomiting, reactions to medications-both toxic and allergic and post Partum back pain. All questions were answered. Patient expressed understanding and wished to proceed. Sterile technique was used throughout procedure. Epidural site was Dressed with sterile barrier dressing. No paresthesias, signs of intravascular injection Or signs of intrathecal spread were encountered.  Patient was more comfortable after the epidural was dosed. Please see RN's note for documentation of vital signs and FHR which are stable.

## 2013-10-28 NOTE — Progress Notes (Signed)
S:  Ctx uncomfortable but not painful       No PIH symptoms  O:  VS: Blood pressure 147/66, pulse 73, temperature 98.1 F (36.7 C), temperature source Oral, resp. rate 18, height 5\' 2"  (1.575 m), weight 87.091 kg (192 lb).        FHR : baseline 120 / variability moderate / accelerations + / no decelerations        Toco: contractions every 3 minutes / pitocin 4 mu/min         Cervix : 3-4cm / 70% / vtx -2         Membranes: intact / stripped with exam  A: Induction of labor - PIH with labile hypertension      IDA of pregnancy      FHR category 1  P:  Repeat labs per anesthesia orders with PIH       Re-establish IV access       Continue labetalol - start HCTZ postpartum       Epidural then AROM in next 2-3 hours    Marlinda Mike CNM, MSN, [facility] 10/28/2013, 7:31 AM

## 2013-10-28 NOTE — Lactation Note (Signed)
This note was copied from the chart of Wendy [name]. Lactation Consultation Note  Patient Name: Wendy [name] [id number] Date: 10/28/2013 Reason for consult: Initial assessment;Breast/nipple pain (irritated/sore (L) nipple; hx of sore/cracked nipples) with first child, now 32 yo.  Mom states she decided to exclusively pump for 9 months due to nipple pain.  Both nipples are everted and baby is sound asleep after bath, STS at mom's (L) breast.  Nipple tip is irritated and inflamed but no visible cracks or blisters.  (R) nipple intact.  Mom states baby latches easily but refuses recent attempt on (L).  LC provided comfort gelpads and demonstrated hand expression for pre and post-feed nipple care prior to applying comfort gelpads.  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 LC encouraged review of Baby and Me pp 14 and 20-25 for STS and BF information. LC provided [employer] brochure and reviewed [facility] services and list of community and web site resources.     Maternal Data Formula Feeding for Exclusion: Yes Reason for exclusion: Mother's choice to formula and breast feed on admission Infant to breast within first hour of birth: Yes (initial LATCH score=9) Has patient been taught Hand Expression?: Yes (LC demonstrated and encouraged expression prior to and after feeds) Does the patient have breastfeeding experience prior to this delivery?: Yes  Feeding Feeding Type: Breast Fed Length of feed: 20 min  LATCH Score/Interventions       Type of Nipple: Everted at rest and after stimulation  Comfort (Breast/Nipple): Filling, red/small blisters or bruises, mild/mod discomfort (left nipple only)  Problem noted: Mild/Moderate discomfort        Lactation Tools Discussed/Used Tools: Comfort gels STS, cue feedings Start on (R) breast then switch after baby has about 10 minutes on (R) to see if she latches more easily to (L)  Consult Status Consult Status: Follow-up Date: 10/29/13 Follow-up type:  In-patient    Warrick Parisian [facility] 10/28/2013, 9:40 PM

## 2013-10-28 NOTE — Anesthesia Preprocedure Evaluation (Signed)
Anesthesia Evaluation  Patient identified by MRN, date of birth, ID band Patient awake    Reviewed: Allergy & Precautions, H&P , Patient's Chart, lab work & pertinent test results  Airway Mallampati: III TM Distance: >3 FB Neck ROM: Full    Dental no notable dental hx. (+) Teeth Intact   Pulmonary neg pulmonary ROS,  breath sounds clear to auscultation  Pulmonary exam normal       Cardiovascular hypertension, Pt. on medications and Pt. on home beta blockers Rhythm:Regular Rate:Normal     Neuro/Psych  Headaches, Hx/o Bell's palsy negative psych ROS   GI/Hepatic Neg liver ROS, GERD-  ,  Endo/Other  negative endocrine ROS  Renal/GU negative Renal ROS  negative genitourinary   Musculoskeletal negative musculoskeletal ROS (+)   Abdominal (+) + obese,   Peds  Hematology negative hematology ROS (+)   Anesthesia Other Findings   Reproductive/Obstetrics (+) Pregnancy PIH                           Anesthesia Physical Anesthesia Plan  ASA: II  Anesthesia Plan: Epidural   Post-op Pain Management:    Induction:   Airway Management Planned: Natural Airway  Additional Equipment:   Intra-op Plan:   Post-operative Plan:   Informed Consent: I have reviewed the patients History and Physical, chart, labs and discussed the procedure including the risks, benefits and alternatives for the proposed anesthesia with the patient or authorized representative who has indicated his/her understanding and acceptance.     Plan Discussed with: Anesthesiologist  Anesthesia Plan Comments:         Anesthesia Quick Evaluation

## 2013-10-29 ENCOUNTER — Encounter (HOSPITAL_COMMUNITY): Payer: Self-pay

## 2013-10-29 LAB — COMPREHENSIVE METABOLIC PANEL
ALT: 12 U/L (ref 0–35)
AST: 23 U/L (ref 0–37)
Albumin: 1.9 g/dL — ABNORMAL LOW (ref 3.5–5.2)
Alkaline Phosphatase: 203 U/L — ABNORMAL HIGH (ref 39–117)
BUN: 10 mg/dL (ref 6–23)
CO2: 25 mEq/L (ref 19–32)
Calcium: 8.3 mg/dL — ABNORMAL LOW (ref 8.4–10.5)
Chloride: 105 mEq/L (ref 96–112)
Creatinine, Ser: 0.58 mg/dL (ref 0.50–1.10)
GFR calc Af Amer: >90 mL/min (ref >90)
GFR calc non Af Amer: >90 mL/min (ref >90)
Glucose, Bld: 92 mg/dL (ref 70–99)
Potassium: 4.1 mEq/L (ref 3.5–5.1)
Sodium: 137 mEq/L (ref 135–145)
Total Bilirubin: 0.2 mg/dL — ABNORMAL LOW (ref 0.3–1.2)
Total Protein: 4.8 g/dL — ABNORMAL LOW (ref 6.0–8.3)

## 2013-10-29 LAB — CBC
HCT: 23.6 % — ABNORMAL LOW (ref 36.0–46.0)
Hemoglobin: 7.8 g/dL — ABNORMAL LOW (ref 12.0–15.0)
MCH: 29.2 pg (ref 26.0–34.0)
MCHC: 33.1 g/dL (ref 30.0–36.0)
MCV: 88.4 fL (ref 78.0–100.0)
Platelets: 184 10*3/uL (ref 150–400)
RBC: 2.67 MIL/uL — ABNORMAL LOW (ref 3.87–5.11)
RDW: 14.3 % (ref 11.5–15.5)
WBC: 11.4 10*3/uL — ABNORMAL HIGH (ref 4.0–10.5)

## 2013-10-29 LAB — URIC ACID: Uric Acid, Serum: 5 mg/dL (ref 2.4–7.0)

## 2013-10-29 NOTE — Lactation Note (Signed)
This note was copied from the chart of Wendy Leanore Biggers. Lactation Consultation Note Mom states breastfeeding has been difficult; baby latches ok to the right side, and has had 5 feedings since birth (mom reports she hears audible swallows) (baby now 74 hours old), but does not latch to the left. Mom also reports sore nipples. Mom attempting to feed baby at this time; offered to assist; mom accepts. Attempted to latch baby to the left side, no latch. Initiated nipple shield after discussing with mom and dad. Baby sleeping, despite attempting to wake baby, baby did not latch. Attempted right side, still no latch. Enc mom to continue frequent STS and cue based feeding. Assisted mom to get baby in STS. Inst mom to start pumping to stimulate milk production and to provide a supplement for baby if one is needed.  Feeding plan is to attempt to latch baby on the easier side (right) first, until breastfeeding gets easier, to use the nipple shield if baby does not latch, to pump and hand express, feed baby any expressed milk with syringe or spoon, to use comfort gels.  Questions answered. Enc mom to call for assistance if needed.   Patient Name: Wendy [name] [id number] Date: 10/29/2013 Reason for consult: Follow-up assessment   Maternal Data    Feeding Feeding Type:  (LC working with pt, baby sleepy at br. )  LATCH Score/Interventions       Type of Nipple: Flat     Problem noted: Mild/Moderate discomfort Interventions (Mild/moderate discomfort): Post-pump;Comfort gels        Lactation Tools Discussed/Used Tools: Pump Nipple shield size: 16 Breast pump type: Double-Electric Breast Pump   Consult Status Consult Status: Follow-up Follow-up type: In-patient    Octavio Manns [facility] 10/29/2013, 11:59 AM

## 2013-10-29 NOTE — Progress Notes (Signed)
Patient ID: Wendy [name], female   DOB: [birth year], 32 y.o.   MRN: [id number] PPD # 1 SVD  S:  Reports feeling a little tired, but well             Tolerating po/ No nausea or vomiting             Bleeding is light             Pain controlled with ibuprofen (OTC)             Up ad lib / ambulatory / voiding without difficulties    Newborn  Information for the patient's newborn:  Laurey, Salser [[id number]]  female  breast feeding   O:  A & O x 3, in no apparent distress              VS:  Filed Vitals:   10/29/13 0324 10/29/13 0553 10/29/13 0603 10/29/13 0840  BP: 138/83 149/92  124/82  Pulse: 74 83  76  Temp: 98.3 F (36.8 C)   97.9 F (36.6 C)  TempSrc: Oral   Oral  Resp: 18   20  Height:      Weight:   83.972 kg (185 lb 2 oz)   SpO2:        LABS:  Recent Labs  10/28/13 1215 10/29/13 0550  WBC 10.2 11.4*  HGB 8.5* 7.8*  HCT 25.6* 23.6*  PLT 197 184    Blood type: --/--/A POS, A POS (11/09 2040)  Rubella: Immune (05/21 0000)     Lungs: Clear and unlabored  Heart: regular rate and rhythm / no murmurs  Abdomen: soft, non-tender, non-distended              Fundus: firm, non-tender, U-1  Perineum: intact  Lochia: light  Extremities: no edema, no calf pain or tenderness, no Homans    A/P: PPD # 1  32 y.o., [id number]   Principal Problem:   Postpartum care following vaginal delivery (11/10) Active Problems:   PIH (pregnancy induced hypertension),postpartum - stable on Labetalol   Dependent edema   Doing well - stable status  Routine post partum orders  Anticipate discharge tomorrow    Raelyn Mora, M, MSN, CNM 10/29/2013, 9:39 AM

## 2013-10-29 NOTE — Lactation Note (Signed)
This note was copied from the chart of Wendy Tonnya Garbett. Lactation Consultation Note Dad request assistance, mom had pumped about 1 ounce, wanted to know how much to feed baby and how. Discussed volume amounts with mom and dad; enc mom to feed baby with curved tip syringe at the breast, or finger feed instead of using a bottle. Mom verbalize understanding. Mom states baby latched well to the left side after she had pumped. Baby just coming off breast when I enter room.  Enc mom to call if she has any concerns.   Patient Name: Wendy [name] [id number] Date: 10/29/2013 Reason for consult: Follow-up assessment   Maternal Data    Feeding Feeding Type:  (LC working with pt, baby sleepy at br. )  LATCH Score/Interventions       Type of Nipple: Flat     Problem noted: Mild/Moderate discomfort Interventions (Mild/moderate discomfort): Post-pump;Comfort gels        Lactation Tools Discussed/Used Tools: Pump Nipple shield size: 16 Breast pump type: Double-Electric Breast Pump   Consult Status Consult Status: Follow-up Follow-up type: In-patient    Octavio Manns [facility] 10/29/2013, 12:35 PM

## 2013-10-29 NOTE — Anesthesia Postprocedure Evaluation (Signed)
  Anesthesia Post-op Note  Patient: Wendy [name]  Procedure(s) Performed: Lumbar Epidural for L& D  Patient Location: Mother/Baby  Anesthesia Type:Epidural  Level of Consciousness: awake, alert  and oriented  Airway and Oxygen Therapy: Patient Spontanous Breathing  Post-op Pain: none  Post-op Assessment: Post-op Vital signs reviewed, Patient's Cardiovascular Status Stable, Respiratory Function Stable, Patent Airway, No signs of Nausea or vomiting, Pain level controlled, No headache, No backache, No residual numbness and No residual motor weakness  Post-op Vital Signs: Reviewed and stable  Complications: No apparent anesthesia complications

## 2013-10-29 NOTE — Progress Notes (Signed)
Interval note - lab review   VS: BP 149/92  Pulse 83  Temp(Src) 98.3 F (36.8 C) (Oral)  Resp 18  Ht 5\' 2"  (1.575 m)  Wt 83.972 kg (185 lb 2 oz)  BMI 33.85 kg/m2  SpO2 96%  BP range: 138-157 / 70-94  LABS:  creat 0.58 / uric acid 5.0 / sgot 23 / sgpt 12              Recent Labs  10/28/13 1215 10/29/13 0550  WBC 10.2 11.4*  HGB 8.5* 7.8*  PLT 197 184               Blood type: --/--/A POS, A POS (11/09 2040)  Rubella: Immune (05/21 0000)                     I&O: Intake/Output     11/10 0701 - 11/11 0700 11/11 0701 - 11/12 0700   Urine (mL/kg/hr) 1400 (0.7)    Total Output 1400     Net -1400           WEIGHT (11/11) 83.9 kg   A/P:  PIH with labile hypertension and dependent edema - labs stable     no repeat labs ordered unless status change     Labetalol 200 TID and HCTZ 25 Qday     continue I&O and daily weights until discharge     BP check in office early next week  IDA of pregnancy compounded by ABL - labs stable     no repeat labs ordered unless status change    need to maintain adequate water intake daily to avoid hypovolemia symptoms with diuretic & BF    Niferex daily and PNV for 12 weeks - will recheck labs at 6 weeks  Marlinda Mike CNM, MSN, [facility] 10/29/2013, 7:33 AM

## 2013-10-30 MED ORDER — HYDROCHLOROTHIAZIDE 25 MG PO TABS: 25.0000 mg | ORAL_TABLET | Freq: Every day | ORAL | Status: DC

## 2013-10-30 MED ORDER — SENNOSIDES-DOCUSATE SODIUM 8.6-50 MG PO TABS: 2.0000 | ORAL_TABLET | Freq: Two times a day (BID) | ORAL | Status: DC | PRN

## 2013-10-30 MED ORDER — POLYSACCHARIDE IRON COMPLEX 150 MG PO CAPS: 150.0000 mg | ORAL_CAPSULE | Freq: Two times a day (BID) | ORAL | Status: DC

## 2013-10-30 MED ORDER — MAGNESIUM OXIDE 400 (241.3 MG) MG PO TABS: 400.0000 mg | ORAL_TABLET | Freq: Every day | ORAL | Status: DC

## 2013-10-30 MED ORDER — LABETALOL HCL 100 MG PO TABS: 200.0000 mg | ORAL_TABLET | Freq: Three times a day (TID) | ORAL | Status: DC

## 2013-10-30 MED ORDER — IBUPROFEN 800 MG PO TABS: 800.0000 mg | ORAL_TABLET | Freq: Three times a day (TID) | ORAL | Status: DC | PRN

## 2013-10-30 NOTE — Discharge Summary (Signed)
Obstetric Discharge Summary Reason for Admission: G2 P1 0 0 1 @ 37 wks 2 days for IOL; PIH, lower extremity edema Prenatal Procedures: NST and ultrasound Intrapartum Procedures: spontaneous vaginal delivery Postpartum Procedures: none Complications-Operative and Postpartum: none Hemoglobin  Date Value Range Status  10/29/2013 7.8* 12.0 - 15.0 g/dL Final     HCT  Date Value Range Status  10/29/2013 23.6* 36.0 - 46.0 % Final    Physical Exam:  General: alert, cooperative and no distress Lochia: appropriate Uterine Fundus: firm Incision: na DVT Evaluation: No evidence of DVT seen on physical exam. Negative Homan's sign.  Discharge Diagnoses: G2 P2 s/p SVD @ 37w 3d ; gest HTN stable and will monitor closely; ABL anemia, will cont iron supplement  Discharge Information: Date: 10/30/2013 Activity: pelvic rest Diet: routine Medications: PNV, Ibuprofen, Colace, Iron and Labetalol and HCTZ Condition: stable Instructions: refer to practice specific booklet Discharge to: home Follow-up Information   Follow up with Lenoard Aden, MD. (BP recheck in the office 11/01/13)    Specialty:  Obstetrics and Gynecology   Contact information:   [address] [place] [address] [phone]       Newborn Data: Live born female on 10/28/13 Birth Weight: 5 lb 13.1 oz (2639 g) APGAR: 9, 9  Home with mother.  Matix Henshaw K 10/30/2013, 9:24 AM

## 2013-10-30 NOTE — Progress Notes (Signed)
Patient ID: Wendy [name], female   DOB: September 22, 1981, 32 y.o.   MRN: [id number] PPD # 2  Subjective: Pt reports feeling well and eager for d/c home.  Notes swelling is decreased and feels much better/ Pain controlled with ibuprofen Tolerating po/ Voiding without problems/ No n/v Bleeding is moderate/ Newborn info:  Information for the patient's newborn:  Iviana, Blasingame [[id number]]  female Feeding: breast    Objective:  VS: Blood pressure 153/98, pulse 73, temperature 97.8 F (36.6 C), temperature source Oral, resp. rate 18.   BP range in past 18 hrs: 146-162/89-101   Recent Labs  10/28/13 1215 10/29/13 0550  WBC 10.2 11.4*  HGB 8.5* 7.8*  HCT 25.6* 23.6*  PLT 197 184    Blood type: A POS Rubella: Immune    Physical Exam:  General:  alert, cooperative and no distress CV: Regular rate and rhythm Resp: clear Abdomen: soft, nontender, normal bowel sounds Uterine Fundus: firm, below umbilicus, nontender Perineum: is normal Lochia: moderate Ext: edema +2 pedal, pretib and to thighs and Homans sign is negative, no sign of DVT    A/P: PPD # 2/ G2P2002/ S/P: SVD @ 37w 3d GestHTN; stable and will monitor closely..blood pressure slightly labile ABL Anemia worsened by chronic anemia Doing well and stable for discharge home RX: Ibuprofen 600mg  po Q 6 hrs prn pain #30 Refill x 1 Niferex 150mg  po QD/BID #30/#60 Refill x 1 Labetalol 200mg  po TID HCTZ 25 mg po #7 Magnesium 400mg  po QD BP check 11/01/13 in the office WOB/GYN booklet given Routine pp visit in 6wks   Demetrius Revel, MSN, [facility] 10/30/2013, 9:04 AM

## 2014-10-20 ENCOUNTER — Encounter (HOSPITAL_COMMUNITY): Payer: Self-pay

## 2016-09-23 MED FILL — BUPROPION HCL XL 150 MG TAB: 150 | 30 days supply | Qty: 30 | Fill #0

## 2016-10-14 MED FILL — PROGESTERONE 200 MG CAPSULE: 200 | 30 days supply | Qty: 30 | Fill #0

## 2016-11-04 LAB — OB RESULTS CONSOLE ANTIBODY SCREEN: Antibody Screen: NEGATIVE

## 2016-11-04 LAB — OB RESULTS CONSOLE HEPATITIS B SURFACE ANTIGEN: Hepatitis B Surface Ag: NEGATIVE

## 2016-11-04 LAB — OB RESULTS CONSOLE ABO/RH: RH Type: POSITIVE

## 2016-11-04 LAB — OB RESULTS CONSOLE RPR: RPR: NONREACTIVE

## 2016-11-04 LAB — OB RESULTS CONSOLE HIV ANTIBODY (ROUTINE TESTING): HIV: NONREACTIVE

## 2016-11-04 LAB — OB RESULTS CONSOLE RUBELLA ANTIBODY, IGM: Rubella: IMMUNE

## 2016-11-04 LAB — OB RESULTS CONSOLE GC/CHLAMYDIA
Chlamydia: NEGATIVE
GC PROBE AMP, GENITAL: NEGATIVE

## 2016-12-19 NOTE — L&D Delivery Note (Signed)
Delivery Note  First Stage: Labor induction with cervical balloon and pitocin concurrently labor onset: 1230 Analgesia /Anesthesia intrapartum: epidural AROM at 1413  Second Stage: Complete dilation at 1520 Onset of pushing at 1625 FHR second stage category 2  Delivery of a viable female at 110 by CNM in LOA position into Dad's hands no nuchal cord Cord double clamped after cessation of pulsation, cut by FIB Cord blood sample collected   Third Stage: Placenta delivered [facility] intact with 3 VC @ 7092 Placenta disposition: hospital disposal Uterine tone firm / bleeding small  no laceration identified  Est. Blood Loss (mL): 957  Complications: none  Mom to postpartum.  Baby to Couplet care / Skin to Skin.  Newborn: Birth Weight: 6-8  Apgar Scores: 9-9 Feeding planned: breast  Artelia Laroche CNM, MSN, [place] 05/25/2017, 4:46 PM

## 2016-12-22 MED FILL — raNITIdine HCL 150 MG TABS: 150 | 30 days supply | Qty: 30 | Fill #0

## 2017-02-22 MED FILL — raNITIdine HCL 150 MG TABS: 150 | 30 days supply | Qty: 30 | Fill #1

## 2017-03-27 DIAGNOSIS — Z23 Encounter for immunization: Secondary | ICD-10-CM | POA: Diagnosis not present

## 2017-05-04 MED FILL — FERREX 150 CAPSULE: 150 | 76 days supply | Qty: 90 | Fill #0

## 2017-05-05 LAB — OB RESULTS CONSOLE GBS: STREP GROUP B AG: NEGATIVE

## 2017-05-25 ENCOUNTER — Inpatient Hospital Stay (HOSPITAL_COMMUNITY): Payer: 59 | Admitting: Anesthesiology

## 2017-05-25 ENCOUNTER — Encounter (HOSPITAL_COMMUNITY): Payer: Self-pay | Admitting: *Deleted

## 2017-05-25 ENCOUNTER — Inpatient Hospital Stay (HOSPITAL_COMMUNITY)
Admission: AD | Admit: 2017-05-25 | Discharge: 2017-05-26 | DRG: 775 | Disposition: A | Discharge status: Home / Self Care | Payer: 59 | Source: Ambulatory Visit | Attending: Obstetrics and Gynecology | Admitting: Obstetrics and Gynecology

## 2017-05-25 DIAGNOSIS — O9902 Anemia complicating childbirth: Secondary | ICD-10-CM | POA: Diagnosis present

## 2017-05-25 DIAGNOSIS — Z6790 Unspecified blood type, Rh positive: Secondary | ICD-10-CM | POA: Diagnosis not present

## 2017-05-25 DIAGNOSIS — Z3A38 38 weeks gestation of pregnancy: Secondary | ICD-10-CM | POA: Diagnosis not present

## 2017-05-25 DIAGNOSIS — O134 Gestational [pregnancy-induced] hypertension without significant proteinuria, complicating childbirth: Secondary | ICD-10-CM | POA: Diagnosis not present

## 2017-05-25 DIAGNOSIS — D649 Anemia, unspecified: Secondary | ICD-10-CM | POA: Diagnosis present

## 2017-05-25 DIAGNOSIS — O139 Gestational [pregnancy-induced] hypertension without significant proteinuria, unspecified trimester: Secondary | ICD-10-CM

## 2017-05-25 LAB — CBC
HCT: 32 % — ABNORMAL LOW (ref 36.0–46.0)
HEMATOCRIT: 29.7 % — AB (ref 36.0–46.0)
Hemoglobin: 10.4 g/dL — ABNORMAL LOW (ref 12.0–15.0)
Hemoglobin: 9.6 g/dL — ABNORMAL LOW (ref 12.0–15.0)
MCH: 30 pg (ref 26.0–34.0)
MCH: 29.9 pg (ref 26.0–34.0)
MCHC: 32.5 g/dL (ref 30.0–36.0)
MCHC: 32.3 g/dL (ref 30.0–36.0)
MCV: 92.2 fL (ref 78.0–100.0)
MCV: 92.5 fL (ref 78.0–100.0)
Platelets: 230 10*3/uL (ref 150–400)
Platelets: 205 10*3/uL (ref 150–400)
RBC: 3.47 MIL/uL — ABNORMAL LOW (ref 3.87–5.11)
RBC: 3.21 MIL/uL — ABNORMAL LOW (ref 3.87–5.11)
RDW: 15.6 % — ABNORMAL HIGH (ref 11.5–15.5)
RDW: 15.6 % — AB (ref 11.5–15.5)
WBC: 10.9 10*3/uL — ABNORMAL HIGH (ref 4.0–10.5)
WBC: 16.4 10*3/uL — AB (ref 4.0–10.5)

## 2017-05-25 LAB — COMPREHENSIVE METABOLIC PANEL
ALT: 13 U/L — ABNORMAL LOW (ref 14–54)
AST: 27 U/L (ref 15–41)
Albumin: 2.7 g/dL — ABNORMAL LOW (ref 3.5–5.0)
Alkaline Phosphatase: 177 U/L — ABNORMAL HIGH (ref 38–126)
Anion gap: 8 (ref 5–15)
BUN: 6 mg/dL (ref 6–20)
CO2: 21 mmol/L — ABNORMAL LOW (ref 22–32)
Calcium: 8.4 mg/dL — ABNORMAL LOW (ref 8.9–10.3)
Chloride: 106 mmol/L (ref 101–111)
Creatinine, Ser: 0.45 mg/dL (ref 0.44–1.00)
GFR calc Af Amer: >60 mL/min (ref >60)
GFR calc non Af Amer: >60 mL/min (ref >60)
Glucose, Bld: 97 mg/dL (ref 65–99)
Potassium: 4.1 mmol/L (ref 3.5–5.1)
Sodium: 135 mmol/L (ref 135–145)
Total Bilirubin: 0.4 mg/dL (ref 0.3–1.2)
Total Protein: 5.9 g/dL — ABNORMAL LOW (ref 6.5–8.1)

## 2017-05-25 LAB — TYPE AND SCREEN
ABO/RH(D): A POS
Antibody Screen: NEGATIVE

## 2017-05-25 MED ORDER — DIBUCAINE 1 % RE OINT: 1.0000 "application " | TOPICAL_OINTMENT | RECTAL | Status: DC | PRN

## 2017-05-25 MED ORDER — WITCH HAZEL-GLYCERIN EX PADS: 1.0000 "application " | MEDICATED_PAD | CUTANEOUS | Status: DC | PRN

## 2017-05-25 MED ORDER — DIPHENHYDRAMINE HCL 50 MG/ML IJ SOLN: 12.5000 mg | INTRAMUSCULAR | Status: DC | PRN

## 2017-05-25 MED ORDER — OXYTOCIN 40 UNITS IN LACTATED RINGERS INFUSION - SIMPLE MED: 2.5000 [IU]/h | INTRAVENOUS | Status: DC

## 2017-05-25 MED ORDER — ACETAMINOPHEN 325 MG PO TABS: 650.0000 mg | ORAL_TABLET | ORAL | Status: DC | PRN

## 2017-05-25 MED ORDER — PHENYLEPHRINE 40 MCG/ML (10ML) SYRINGE FOR IV PUSH (FOR BLOOD PRESSURE SUPPORT)
80.0000 ug | PREFILLED_SYRINGE | INTRAVENOUS | Status: DC | PRN
  Filled 2017-05-25: qty 10
  Filled 2017-05-25: qty 5

## 2017-05-25 MED ORDER — LACTATED RINGERS IV SOLN
INTRAVENOUS | Status: DC
  Administered 2017-05-25 (×2): via INTRAVENOUS

## 2017-05-25 MED ORDER — FENTANYL 2.5 MCG/ML BUPIVACAINE 1/10 % EPIDURAL INFUSION (WH - ANES)
14.0000 mL/h | INTRAMUSCULAR | Status: DC | PRN
  Administered 2017-05-25: 14 mL/h via EPIDURAL
  Filled 2017-05-25: qty 100

## 2017-05-25 MED ORDER — OXYTOCIN 40 UNITS IN LACTATED RINGERS INFUSION - SIMPLE MED
1.0000 m[IU]/min | INTRAVENOUS | Status: DC
  Administered 2017-05-25: 2 m[IU]/min via INTRAVENOUS
  Filled 2017-05-25: qty 1000

## 2017-05-25 MED ORDER — LIDOCAINE HCL (PF) 1 % IJ SOLN
INTRAMUSCULAR | Status: DC | PRN
  Administered 2017-05-25 (×2): 4 mL

## 2017-05-25 MED ORDER — LIDOCAINE HCL (PF) 1 % IJ SOLN
30.0000 mL | INTRAMUSCULAR | Status: DC | PRN
  Filled 2017-05-25: qty 30

## 2017-05-25 MED ORDER — OXYCODONE-ACETAMINOPHEN 5-325 MG PO TABS: 2.0000 | ORAL_TABLET | ORAL | Status: DC | PRN

## 2017-05-25 MED ORDER — COCONUT OIL OIL: 1.0000 "application " | TOPICAL_OIL | Status: DC | PRN

## 2017-05-25 MED ORDER — EPHEDRINE 5 MG/ML INJ
10.0000 mg | INTRAVENOUS | Status: DC | PRN
  Filled 2017-05-25: qty 2

## 2017-05-25 MED ORDER — OXYCODONE-ACETAMINOPHEN 5-325 MG PO TABS: 1.0000 | ORAL_TABLET | ORAL | Status: DC | PRN

## 2017-05-25 MED ORDER — OXYTOCIN 10 UNIT/ML IJ SOLN: 10.0000 [IU] | Freq: Once | INTRAMUSCULAR | Status: DC

## 2017-05-25 MED ORDER — MAGNESIUM OXIDE 400 (241.3 MG) MG PO TABS
400.0000 mg | ORAL_TABLET | Freq: Every day | ORAL | Status: DC
  Administered 2017-05-25 – 2017-05-26 (×2): 400 mg via ORAL
  Filled 2017-05-25 (×3): qty 1

## 2017-05-25 MED ORDER — TERBUTALINE SULFATE 1 MG/ML IJ SOLN
0.2500 mg | Freq: Once | INTRAMUSCULAR | Status: DC | PRN
  Filled 2017-05-25: qty 1

## 2017-05-25 MED ORDER — OXYTOCIN BOLUS FROM INFUSION
500.0000 mL | Freq: Once | INTRAVENOUS | Status: AC
  Administered 2017-05-25: 500 mL via INTRAVENOUS

## 2017-05-25 MED ORDER — SIMETHICONE 80 MG PO CHEW: 80.0000 mg | CHEWABLE_TABLET | ORAL | Status: DC | PRN

## 2017-05-25 MED ORDER — LACTATED RINGERS IV SOLN: 500.0000 mL | Freq: Once | INTRAVENOUS | Status: DC

## 2017-05-25 MED ORDER — SOD CITRATE-CITRIC ACID 500-334 MG/5ML PO SOLN: 30.0000 mL | ORAL | Status: DC | PRN

## 2017-05-25 MED ORDER — POLYSACCHARIDE IRON COMPLEX 150 MG PO CAPS
150.0000 mg | ORAL_CAPSULE | Freq: Every day | ORAL | Status: DC
Start: 2017-05-26 — End: 2017-05-26
  Administered 2017-05-26: 150 mg via ORAL
  Filled 2017-05-25: qty 1

## 2017-05-25 MED ORDER — SENNOSIDES-DOCUSATE SODIUM 8.6-50 MG PO TABS
2.0000 | ORAL_TABLET | ORAL | Status: DC
  Administered 2017-05-25: 2 via ORAL
  Filled 2017-05-25: qty 2

## 2017-05-25 MED ORDER — BUPROPION HCL ER (XL) 150 MG PO TB24
150.0000 mg | ORAL_TABLET | Freq: Every day | ORAL | Status: DC
  Administered 2017-05-25 – 2017-05-26 (×2): 150 mg via ORAL
  Filled 2017-05-25 (×3): qty 1

## 2017-05-25 MED ORDER — LACTATED RINGERS IV SOLN: 500.0000 mL | INTRAVENOUS | Status: DC | PRN

## 2017-05-25 MED ORDER — BENZOCAINE-MENTHOL 20-0.5 % EX AERO
1.0000 "application " | INHALATION_SPRAY | CUTANEOUS | Status: DC | PRN
  Administered 2017-05-25: 1 via TOPICAL
  Filled 2017-05-25 (×2): qty 56

## 2017-05-25 MED ORDER — FENTANYL 2.5 MCG/ML BUPIVACAINE 1/10 % EPIDURAL INFUSION (WH - ANES): 14.0000 mL/h | INTRAMUSCULAR | Status: DC | PRN

## 2017-05-25 MED ORDER — PHENYLEPHRINE 40 MCG/ML (10ML) SYRINGE FOR IV PUSH (FOR BLOOD PRESSURE SUPPORT)
80.0000 ug | PREFILLED_SYRINGE | INTRAVENOUS | Status: DC | PRN
  Filled 2017-05-25: qty 5

## 2017-05-25 MED ORDER — IBUPROFEN 600 MG PO TABS
600.0000 mg | ORAL_TABLET | Freq: Four times a day (QID) | ORAL | Status: DC
  Administered 2017-05-25 – 2017-05-26 (×3): 600 mg via ORAL
  Filled 2017-05-25 (×4): qty 1

## 2017-05-25 MED ORDER — HYDROCHLOROTHIAZIDE 25 MG PO TABS
25.0000 mg | ORAL_TABLET | Freq: Every day | ORAL | Status: DC
  Administered 2017-05-25 – 2017-05-26 (×2): 25 mg via ORAL
  Filled 2017-05-25 (×3): qty 1

## 2017-05-25 NOTE — Progress Notes (Signed)
S:  feels anxious this am - denies any PIH symptoms (no headache or vision changes)        worsening edema in past 2 weeks  O:  VS: Blood pressure (!) 154/94, pulse 92, temperature 97.8 F (36.6 C), temperature source Oral, resp. rate 18, height 5\' 1"  (1.549 m), weight 95.3 kg (210 lb), unknown if currently breastfeeding.        FHR : baseline 130 / variability moderate / accelerations + / no decelerations        Toco: no ctx        Cervix : 3cm / 70% vtx -2        Membranes: swept / cervical balloon placed and inflated with 60 ml  A: induction of labor     FHR category 1     Gestational hypertension with significant generalized edema    P: admit       induce - balloon with concurrent pitocin for active management       PIH labs       diuretic PP with strict I&O and daily weight   Artelia Laroche CNM, MSN, [facility] 05/25/2017, 9:43 AM

## 2017-05-25 NOTE — Anesthesia Preprocedure Evaluation (Signed)
Anesthesia Evaluation  Patient identified by MRN, date of birth, ID band Patient awake    Reviewed: Allergy & Precautions, H&P , Patient's Chart, lab work & pertinent test results  Airway Mallampati: III  TM Distance: >3 FB Neck ROM: Full    Dental no notable dental hx. (+) Teeth Intact   Pulmonary neg pulmonary ROS,    Pulmonary exam normal breath sounds clear to auscultation       Cardiovascular hypertension, Pt. on medications and Pt. on home beta blockers Normal cardiovascular exam Rhythm:Regular Rate:Normal     Neuro/Psych  Headaches, Hx/o Bell's palsy negative psych ROS   GI/Hepatic Neg liver ROS, GERD  ,  Endo/Other  negative endocrine ROS  Renal/GU negative Renal ROS  negative genitourinary   Musculoskeletal negative musculoskeletal ROS (+)   Abdominal (+) + obese,   Peds  Hematology negative hematology ROS (+)   Anesthesia Other Findings   Reproductive/Obstetrics (+) Pregnancy PIH                             Anesthesia Physical Anesthesia Plan  ASA: III  Anesthesia Plan: Epidural   Post-op Pain Management:    Induction:   PONV Risk Score and Plan:   Airway Management Planned:   Additional Equipment:   Intra-op Plan:   Post-operative Plan:   Informed Consent: I have reviewed the patients History and Physical, chart, labs and discussed the procedure including the risks, benefits and alternatives for the proposed anesthesia with the patient or authorized representative who has indicated his/her understanding and acceptance.     Plan Discussed with:   Anesthesia Plan Comments:         Anesthesia Quick Evaluation

## 2017-05-25 NOTE — Anesthesia Pain Management Evaluation Note (Signed)
  CRNA Pain Management Visit Note  Patient: Wendy [name], 36 y.o., female  "Hello I am a member of the anesthesia team at [facility]. We have an anesthesia team available at all times to provide care throughout the hospital, including epidural management and anesthesia for C-section. I don't know your plan for the delivery whether it a natural birth, water birth, IV sedation, nitrous supplementation, doula or epidural, but we want to meet your pain goals."   1.Was your pain managed to your expectations on prior hospitalizations?   Yes   2.What is your expectation for pain management during this hospitalization?     Epidural  3.How can we help you reach that goal? epidural  Record the patient's initial score and the patient's pain goal.   Pain: 0  Pain Goal: 3 [facility] wants you to be able to say your pain was always managed very well.  Vic Esco 05/25/2017

## 2017-05-25 NOTE — Anesthesia Procedure Notes (Signed)
Epidural Patient location during procedure: OB  Staffing Anesthesiologist: Nolon Nations Performed: anesthesiologist   Preanesthetic Checklist Completed: patient identified, pre-op evaluation, timeout performed, IV checked, risks and benefits discussed and monitors and equipment checked  Epidural Patient position: sitting Prep: site prepped and draped and DuraPrep Patient monitoring: heart rate, continuous pulse ox and blood pressure Approach: midline Location: L3-L4 Injection technique: LOR air and LOR saline  Needle:  Needle type: Tuohy  Needle gauge: 17 G Needle length: 9 cm Needle insertion depth: 7 cm Catheter type: closed end flexible Catheter size: 19 Gauge Catheter at skin depth: 12 cm Test dose: negative  Assessment Sensory level: T8 Events: blood not aspirated, injection not painful, no injection resistance, negative IV test and no paresthesia  Additional Notes Reason for block:procedure for pain

## 2017-05-26 ENCOUNTER — Encounter (HOSPITAL_COMMUNITY): Payer: Self-pay | Admitting: *Deleted

## 2017-05-26 LAB — RPR: RPR Ser Ql: NONREACTIVE

## 2017-05-26 LAB — CBC
HCT: 28.6 % — ABNORMAL LOW (ref 36.0–46.0)
Hemoglobin: 9.4 g/dL — ABNORMAL LOW (ref 12.0–15.0)
MCH: 30.1 pg (ref 26.0–34.0)
MCHC: 32.9 g/dL (ref 30.0–36.0)
MCV: 91.7 fL (ref 78.0–100.0)
Platelets: 202 10*3/uL (ref 150–400)
RBC: 3.12 MIL/uL — ABNORMAL LOW (ref 3.87–5.11)
RDW: 15.7 % — ABNORMAL HIGH (ref 11.5–15.5)
WBC: 13.2 10*3/uL — ABNORMAL HIGH (ref 4.0–10.5)

## 2017-05-26 MED ORDER — HYDROCHLOROTHIAZIDE 25 MG PO TABS: 25.0000 mg | ORAL_TABLET | Freq: Every day | ORAL | 0 refills | Status: DC

## 2017-05-26 MED ORDER — BUPROPION HCL ER (XL) 150 MG PO TB24: 150.0000 mg | ORAL_TABLET | Freq: Every day | ORAL | 0 refills | Status: DC

## 2017-05-26 MED ORDER — POLYSACCHARIDE IRON COMPLEX 150 MG PO CAPS: 150.0000 mg | ORAL_CAPSULE | Freq: Every day | ORAL | 1 refills | Status: DC

## 2017-05-26 MED ORDER — MAGNESIUM OXIDE 400 (241.3 MG) MG PO TABS: 400.0000 mg | ORAL_TABLET | Freq: Every day | ORAL | 1 refills | Status: DC

## 2017-05-26 MED ORDER — IBUPROFEN 600 MG PO TABS: 600.0000 mg | ORAL_TABLET | Freq: Four times a day (QID) | ORAL | 0 refills | Status: DC

## 2017-05-26 MED ORDER — FAMOTIDINE 20 MG PO TABS: 20.0000 mg | ORAL_TABLET | Freq: Every day | ORAL | Status: DC

## 2017-05-26 MED FILL — HYDROCHLOROTHIAZIDE 25 MG T: 25 | 7 days supply | Qty: 7 | Fill #0

## 2017-05-26 MED FILL — IBUPROFEN 600 MG TABLET: 600 | 7 days supply | Qty: 30 | Fill #0

## 2017-05-26 MED FILL — BUPROPION XL 150 MG TAB: 150 | 30 days supply | Qty: 30 | Fill #0

## 2017-05-26 NOTE — Lactation Note (Signed)
This note was copied from a baby's chart. Lactation Consultation Note  Patient Name: Wendy [name] [id number] Date: 05/26/2017 Reason for consult: Follow-up assessment Observed the last few minutes of a 15 minute feeding.  Mom states baby latched easily and fed well.  Denies pain or discomfort with feeding.  Nipple round when baby came off.  Mom has a medela DEBP at home.  Questions answered.  Reviewed outpatient lactation services and encouraged to call prn.  Maternal Data    Feeding Feeding Type: Breast Fed Length of feed: 15 min  LATCH Score/Interventions Latch: Grasps breast easily, tongue down, lips flanged, rhythmical sucking.  Audible Swallowing: A few with stimulation Intervention(s): Skin to skin  Type of Nipple: Everted at rest and after stimulation  Comfort (Breast/Nipple): Soft / non-tender     Hold (Positioning): No assistance needed to correctly position infant at breast.  LATCH Score: 9  Lactation Tools Discussed/Used     Consult Status Consult Status: Complete    Ave Filter 05/26/2017, 12:09 PM

## 2017-05-26 NOTE — Anesthesia Postprocedure Evaluation (Signed)
Anesthesia Post Note  Patient: Wendy [name]  Procedure(s) Performed: * No procedures listed *     Patient location during evaluation: Mother Baby Anesthesia Type: Epidural Level of consciousness: awake and alert and oriented Pain management: satisfactory to patient Vital Signs Assessment: post-procedure vital signs reviewed and stable Respiratory status: spontaneous breathing and nonlabored ventilation Cardiovascular status: stable Postop Assessment: no headache, no backache, no signs of nausea or vomiting, adequate PO intake and patient able to bend at knees (patient up walking) Anesthetic complications: no    Last Vitals:  Vitals:   05/25/17 2331 05/26/17 0613  BP: 136/83 (!) 141/72  Pulse: 89 76  Resp: 18 18  Temp: 36.4 C 36.6 C    Last Pain:  Vitals:   05/26/17 0613  TempSrc: Oral  PainSc:    Pain Goal: Patients Stated Pain Goal: 2 (05/25/17 2331)               Willa Rough

## 2017-05-26 NOTE — Lactation Note (Signed)
This note was copied from a baby's chart. Lactation Consultation Note Mom anxious about BF d/t having trouble w/last 2 children. Mom has flat nipples, very compressible. Shells given on previous visit, mom wore in bra. Noted everted soft nipples for baby to obtain a deep lat in football position.  Encouraged mom to take deep breaths to relax. Assisted in comfortable position. Moms lower back hurting, pillows for support, mom has ICE pack to lower back.  Discussed using props for comfort, folded baby blanket under mom's hand supporting baby's head.  Baby latched well. Slow at first w/suckling. Finally started BF well. Heard gentle swallows. Baby has mucous in throat from previous emesis. Color WDL, resp. WDL. Discussed w/mom things to looks for to call RN for respiratory issues. Hand expression taught collecting 5 ml. Some colostrum had leaked in shells. Rinsed, colostrum storage discussed. Hand pump given previously. Mom shown how to use DEBP & how to disassemble, clean, & reassemble parts. Mom to pump after BF if breast are leaking or full. Encouraged to massage any knots. Mom encouraged to feed baby 8-12 times/24 hours and with feeding cues. Wake baby if hasn't cued in 3 hrs.  Encouraged STS, I&O, supply and demand. Mom calmer, excited baby BF well and has good flow of colostrum. Praised mom for good positioning and breast massage. Mom done frequent teach back of information taught.  [place] brochure given w/resources, support groups and [place] services. Patient Name: Wendy [name] [id number] Date: 05/26/2017 Reason for consult: Initial assessment   Maternal Data Has patient been taught Hand Expression?: Yes Does the patient have breastfeeding experience prior to this delivery?: Yes  Feeding Feeding Type: Breast Fed Length of feed: 25 min  LATCH Score/Interventions Latch: Grasps breast easily, tongue down, lips flanged, rhythmical sucking.  Audible Swallowing: Spontaneous and  intermittent Intervention(s): Skin to skin;Hand expression  Type of Nipple: Everted at rest and after stimulation Intervention(s): Shells;Hand pump;Double electric pump  Comfort (Breast/Nipple): Soft / non-tender     Hold (Positioning): Assistance needed to correctly position infant at breast and maintain latch. Intervention(s): Breastfeeding basics reviewed;Support Pillows;Position options;Skin to skin  LATCH Score: 9  Lactation Tools Discussed/Used Tools: Shells;Pump Shell Type: Inverted Breast pump type: Manual WIC Program: No Pump Review: Setup, frequency, and cleaning;Milk Storage Initiated by:: Allayne Stack RN IBCLC Date initiated:: 05/26/17   Consult Status Consult Status: Follow-up Date: 05/26/17 Follow-up type: In-patient    Wendy [name], [name] 05/26/2017, 4:14 AM

## 2017-05-26 NOTE — Lactation Note (Signed)
This note was copied from a baby's chart. Lactation Consultation Note Mom's 3rd child. First child mom pumped and bottle for for 9 months. Her 2nd child mom BF for 2 months and stopped d/t PPD. Mom really wants to BF this baby. Educated newborn behavior and feeding habits. Encouraged STS. Has large pendulum breast, flat nipples. Shells given to wear, hand pump given to pre-pump to evert nipples prior to latching. Mom resting, baby sleeping. Asked mom to call for next feeding for [facility] assistance.  Reported to RN to call [facility] for assistance for next feeding.   Patient Name: Wendy [name] Date: 05/26/2017 Reason for consult: Initial assessment   Maternal Data    Feeding Feeding Type: Breast Fed  LATCH Score/Interventions       Type of Nipple: Flat Intervention(s): Shells;Hand pump  Comfort (Breast/Nipple): Soft / non-tender           Lactation Tools Discussed/Used Tools: Shells;Pump Shell Type: Inverted Breast pump type: Manual WIC Program: No Initiated by:: Allayne Stack RN IBCLC Date initiated:: 05/26/17   Consult Status Consult Status: Follow-up    Jesselee Poth, [name] 05/26/2017, 1:06 AM

## 2017-05-26 NOTE — Progress Notes (Signed)
C/o reflux, would like something longer lasting than tums No other c/o - normal bleeding, minimal pain Wants d/c home later today Nursing  Patient Vitals for the past 24 hrs:  BP Temp Temp src Pulse Resp Weight  05/26/17 1045 134/79 - - 87 - -  05/26/17 0613 (!) 141/72 97.8 F (36.6 C) Oral 76 18 200 lb 6.4 oz (90.9 kg)  05/26/17 0032 - - - - - 202 lb (91.6 kg)  05/25/17 2331 136/83 97.6 F (36.4 C) Oral 89 18 -  05/25/17 1949 (!) 142/79 98 F (36.7 C) Oral 88 18 -  05/25/17 1840 127/67 97.9 F (36.6 C) Oral 82 18 -  05/25/17 1830 - - - - 18 -  05/25/17 1805 134/71 97.5 F (36.4 C) Oral 99 18 -  05/25/17 1730 133/71 - - 79 18 -  05/25/17 1715 124/72 - - 81 18 -  05/25/17 1702 136/87 - - 85 18 -  05/25/17 1646 (!) 108/94 - - 88 - -  05/25/17 1610 127/65 - - 74 18 -  05/25/17 1531 136/72 - - 78 18 -  05/25/17 1502 134/78 97.5 F (36.4 C) Oral 74 18 -  05/25/17 1430 125/70 - - 78 18 -  05/25/17 1400 128/71 - - 78 16 -  05/25/17 1357 131/77 - - 82 16 -  05/25/17 1351 128/77 - - 87 16 -  05/25/17 1347 125/74 - - 80 - -  05/25/17 1341 135/88 - - 87 - -  05/25/17 1336 129/73 - - 91 - -  05/25/17 1232 129/75 - - 83 18 -   A&ox3 rrr ctab Abd: soft, nt, fundus firm and 1cm below umb LE: +1 edema, nt bilT  CBC    Component Value Date/Time   WBC 13.2 (H) 05/26/2017 0517   RBC 3.12 (L) 05/26/2017 0517   HGB 9.4 (L) 05/26/2017 0517   HCT 28.6 (L) 05/26/2017 0517   PLT 202 05/26/2017 0517   MCV 91.7 05/26/2017 0517   MCH 30.1 05/26/2017 0517   MCHC 32.9 05/26/2017 0517   RDW 15.7 (H) 05/26/2017 0517   A/p: ppd1 s/p svd 1. Doing well, contin care; d/c home after 24 hrs; begin pepcid for reflux 2. Rh pos 3. Mild anemia - begin iron q day

## 2017-05-26 NOTE — Discharge Instructions (Signed)

## 2017-05-26 NOTE — Discharge Summary (Addendum)
Obstetric Discharge Summary Reason for Admission: induction of labor Prenatal Procedures: n/a Intrapartum Procedures: spontaneous vaginal delivery Postpartum Procedures: none Complications-Operative and Postpartum: none Hemoglobin  Date Value Ref Range Status  05/26/2017 9.4 (L) 12.0 - 15.0 g/dL Final   HCT  Date Value Ref Range Status  05/26/2017 28.6 (L) 36.0 - 46.0 % Final    Physical Exam:  General: alert and cooperative Lochia: appropriate Uterine Fundus: firm Incision: n/a DVT Evaluation: No significant calf/ankle edema.  Discharge Diagnoses: Term Pregnancy-delivered  Discharge Information: Date: 05/26/2017 Activity: pelvic rest Diet: routine Medications:  Allergies as of 05/26/2017   No Known Allergies     Medication List    TAKE these medications   acetaminophen 500 MG tablet Commonly known as:  TYLENOL Take 500 mg by mouth every 6 (six) hours as needed.   buPROPion 150 MG 24 hr tablet Commonly known as:  WELLBUTRIN XL Take 1 tablet (150 mg total) by mouth daily. Start taking on:  05/27/2017   calcium carbonate 500 MG chewable tablet Commonly known as:  TUMS - dosed in mg elemental calcium Chew 1 tablet by mouth 2 (two) times daily as needed for indigestion or heartburn.   diphenhydrAMINE 25 MG tablet Commonly known as:  BENADRYL Take 25 mg by mouth at bedtime as needed for sleep.   hydrochlorothiazide 25 MG tablet Commonly known as:  HYDRODIURIL Take 1 tablet (25 mg total) by mouth daily. Start taking on:  05/27/2017   ibuprofen 600 MG tablet Commonly known as:  ADVIL,MOTRIN Take 1 tablet (600 mg total) by mouth every 6 (six) hours.   iron polysaccharides 150 MG capsule Commonly known as:  NIFEREX Take 1 capsule (150 mg total) by mouth daily.   magnesium oxide 400 (241.3 Mg) MG tablet Commonly known as:  MAG-OX Take 1 tablet (400 mg total) by mouth daily.   prenatal multivitamin Tabs tablet Take 1 tablet by mouth daily at 12 noon.   ranitidine  150 MG tablet Commonly known as:  ZANTAC Take 150 mg by mouth at bedtime.      Condition: stable Instructions: refer to practice specific booklet Discharge to: home   Newborn Data: Live born female  Birth Weight: 6 lb 8.6 oz (2965 g) APGAR: 9, 9  Home with mother.  Charyl Bigger 05/26/2017, 12:29 PM

## 2017-06-08 NOTE — H&P (Signed)
OB ADMISSION/ HISTORY & PHYSICAL:  Admission Date: 05/25/2017  8:30 AM  Admit Diagnosis: gestational hypertension  Wendy [name] is a 36 y.o. female presenting for IOL for gestational hypertension.  Prenatal History: [id number]   EDC : 06/02/2017, by Patient Reported  Prenatal care at [facility]  Primary Ob Provider: Mel Almond CNM Prenatal course complicated by previous pre-eclampsia / gestational hypertension, dependent edema  Prenatal Labs: ABO, Rh: --/--/A POS (06/07 0920) Antibody: NEG (06/07 0920) Rubella: Immune (11/17 0000)  RPR: Non Reactive (06/07 0920)  HBsAg: Negative (11/17 0000)  HIV: Non-reactive (11/17 0000)  GTT: NL GBS: Negative (05/18 0000)   Medical / Surgical History :  Past medical history:  Past Medical History:  Diagnosis Date  . Abnormal Pap smear   . Anemia   . Bell's palsy   . Female infertility associated with anovulation   . Hx of varicella   . Hypertension   . Induction of Labor 10/28/2013  . Migraine   . Pregnancy induced hypertension    labetolol til     Past surgical history:  Past Surgical History:  Procedure Laterality Date  . COLPOSCOPY    . WISDOM TOOTH EXTRACTION      Family History:  Family History  Problem Relation Age of Onset  . Hypertension Mother   . Diabetes Mother   . Stroke Maternal Grandfather   . Hypertension Maternal Grandfather   . Diabetes Maternal Grandfather   . Heart disease Paternal Grandmother   . Kidney disease Paternal Grandmother   . Heart disease Paternal Grandfather   . Heart attack Paternal Grandfather      Social History:  reports that she has never smoked. She has never used smokeless tobacco. She reports that she does not drink alcohol or use drugs.   Allergies: Patient has no known allergies.    Current Medications at time of admission:  Prior to Admission medications   Medication Sig Start Date End Date Taking? Authorizing Provider  acetaminophen (TYLENOL) 500 MG  tablet Take 500 mg by mouth every 6 (six) hours as needed.   Yes [provider]  calcium carbonate (TUMS - DOSED IN MG ELEMENTAL CALCIUM) 500 MG chewable tablet Chew 1 tablet by mouth 2 (two) times daily as needed for indigestion or heartburn.   Yes [provider]  diphenhydrAMINE (BENADRYL) 25 MG tablet Take 25 mg by mouth at bedtime as needed for sleep.   Yes [provider]  Prenatal Vit-Fe Fumarate-FA (PRENATAL MULTIVITAMIN) TABS tablet Take 1 tablet by mouth daily at 12 noon.   Yes [provider]  ranitidine (ZANTAC) 150 MG tablet Take 150 mg by mouth at bedtime.    Yes [provider]  buPROPion (WELLBUTRIN XL) 150 MG 24 hr tablet Take 1 tablet (150 mg total) by mouth daily. 05/27/17   Juliene Pina, CNM  hydrochlorothiazide (HYDRODIURIL) 25 MG tablet Take 1 tablet (25 mg total) by mouth daily. 05/27/17   Juliene Pina, CNM  ibuprofen (ADVIL,MOTRIN) 600 MG tablet Take 1 tablet (600 mg total) by mouth every 6 (six) hours. 05/26/17   Charyl Bigger, MD  iron polysaccharides (NIFEREX) 150 MG capsule Take 1 capsule (150 mg total) by mouth daily. 05/26/17   Juliene Pina, CNM  magnesium oxide (MAG-OX) 400 (241.3 Mg) MG tablet Take 1 tablet (400 mg total) by mouth daily. 05/26/17   Juliene Pina, CNM    Review of Systems: Active FM Significant 3+ edema / no headache or  vision changes  Physical Exam:  VS: Blood pressure 134/79, pulse 87, temperature 97.8 F (36.6 C), temperature source Oral, resp. rate 18, height 5\' 1"  (1.549 m), weight 90.9 kg (200 lb 6.4 oz), unknown if currently breastfeeding.  General: alert and oriented, appears comfortable and moderately anxious Heart: RRR Lungs: Clear lung fields Abdomen: Gravid, soft and non-tender, non-distended / uterus: gravid Extremities: 3+ edema  Genitalia / VE: Dilation:  (svd) Effacement (%): 100 Station: +1 Exam by:: Artelia Laroche, cnm  FHR: category 1  Assessment: 38.[redacted] weeks  gestation induction of labor for gestational hypertension and worsening dependent edema FHR category 1   Plan:  Admit IOL    Artelia Laroche CNM, MSN, [facility] 06/08/2017, 11:43 AM

## 2017-06-12 MED FILL — HYDROCHLOROTHIAZIDE 12.5 MG: 12.5 | 14 days supply | Qty: 14 | Fill #0

## 2017-08-01 DIAGNOSIS — D485 Neoplasm of uncertain behavior of skin: Secondary | ICD-10-CM | POA: Diagnosis not present

## 2017-08-01 DIAGNOSIS — D2271 Melanocytic nevi of right lower limb, including hip: Secondary | ICD-10-CM | POA: Diagnosis not present

## 2017-12-29 DIAGNOSIS — Z3482 Encounter for supervision of other normal pregnancy, second trimester: Secondary | ICD-10-CM | POA: Diagnosis not present

## 2017-12-29 DIAGNOSIS — Z3483 Encounter for supervision of other normal pregnancy, third trimester: Secondary | ICD-10-CM | POA: Diagnosis not present

## 2018-04-17 DIAGNOSIS — H1031 Unspecified acute conjunctivitis, right eye: Secondary | ICD-10-CM | POA: Diagnosis not present

## 2018-07-18 MED FILL — SPIRONOLACTONE 50 MG TABS: 50 | 30 days supply | Qty: 30 | Fill #0

## 2018-07-18 MED FILL — LEVONOR-ETH ESTRAD 0.15-0.0: 0.15-30 | 84 days supply | Qty: 84 | Fill #0

## 2018-08-06 DIAGNOSIS — Z01419 Encounter for gynecological examination (general) (routine) without abnormal findings: Secondary | ICD-10-CM | POA: Diagnosis not present

## 2018-08-06 DIAGNOSIS — Z6830 Body mass index (BMI) 30.0-30.9, adult: Secondary | ICD-10-CM | POA: Diagnosis not present

## 2018-12-26 MED FILL — LEVONOR-ETH ESTRAD 0.15-0.0: 0.15-30 | 84 days supply | Qty: 84 | Fill #0

## 2019-01-21 DIAGNOSIS — J0100 Acute maxillary sinusitis, unspecified: Secondary | ICD-10-CM | POA: Diagnosis not present

## 2019-10-31 ENCOUNTER — Emergency Department (HOSPITAL_BASED_OUTPATIENT_CLINIC_OR_DEPARTMENT_OTHER)
Admission: EM | Admit: 2019-10-31 | Discharge: 2019-10-31 | Disposition: A | Discharge status: Home / Self Care | Payer: 59 | Attending: Emergency Medicine | Admitting: Emergency Medicine

## 2019-10-31 ENCOUNTER — Other Ambulatory Visit: Payer: Self-pay

## 2019-10-31 ENCOUNTER — Encounter (HOSPITAL_BASED_OUTPATIENT_CLINIC_OR_DEPARTMENT_OTHER): Payer: Self-pay | Admitting: *Deleted

## 2019-10-31 DIAGNOSIS — Y999 Unspecified external cause status: Secondary | ICD-10-CM | POA: Diagnosis not present

## 2019-10-31 DIAGNOSIS — S299XXA Unspecified injury of thorax, initial encounter: Secondary | ICD-10-CM | POA: Diagnosis present

## 2019-10-31 DIAGNOSIS — Y9389 Activity, other specified: Secondary | ICD-10-CM | POA: Diagnosis not present

## 2019-10-31 DIAGNOSIS — S29019A Strain of muscle and tendon of unspecified wall of thorax, initial encounter: Secondary | ICD-10-CM

## 2019-10-31 DIAGNOSIS — S29012A Strain of muscle and tendon of back wall of thorax, initial encounter: Secondary | ICD-10-CM | POA: Insufficient documentation

## 2019-10-31 DIAGNOSIS — Y92410 Unspecified street and highway as the place of occurrence of the external cause: Secondary | ICD-10-CM | POA: Insufficient documentation

## 2019-10-31 MED ORDER — CYCLOBENZAPRINE HCL 10 MG PO TABS: 10.0000 mg | ORAL_TABLET | Freq: Two times a day (BID) | ORAL | 0 refills | Status: DC | PRN

## 2019-10-31 NOTE — ED Notes (Signed)
ED Provider at bedside. 

## 2019-10-31 NOTE — Discharge Instructions (Addendum)
Continue 600 mg of Motrin at home every 8 hours as needed for pain.  Use muscle relaxant as needed but do not mix with alcohol or drugs.
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
## 2019-11-06 ENCOUNTER — Encounter: Payer: Self-pay | Admitting: Family Medicine

## 2019-11-06 ENCOUNTER — Other Ambulatory Visit: Payer: Self-pay

## 2019-11-06 ENCOUNTER — Ambulatory Visit (INDEPENDENT_AMBULATORY_CARE_PROVIDER_SITE_OTHER): Payer: 59 | Admitting: Family Medicine

## 2019-11-06 VITALS — BP 110/82 | HR 81 | Ht 60.0 in | Wt 140.8 lb

## 2019-11-06 DIAGNOSIS — S46812A Strain of other muscles, fascia and tendons at shoulder and upper arm level, left arm, initial encounter: Secondary | ICD-10-CM | POA: Diagnosis not present

## 2019-11-06 DIAGNOSIS — M542 Cervicalgia: Secondary | ICD-10-CM

## 2019-11-06 MED FILL — HYDROCHLOROTHIAZIDE 12.5 MG: 12.5 | 30 days supply | Qty: 30 | Fill #0

## 2019-11-06 MED FILL — buPROPion HCL ER (XL) 150 M: 150 | 30 days supply | Qty: 30 | Fill #0

## 2019-11-06 NOTE — Patient Instructions (Signed)
Thank you for coming in today. Use heating pad and TENS unit.  Attend PT.  Recheck if not improving or if worsening.   TENS UNIT: This is helpful for muscle pain and spasm.   Search and Purchase a TENS 7000 2nd edition at  www.tenspros.com or [email or web address] It should be less than $30.     TENS unit instructions: Do not shower or bathe with the unit on Turn the unit off before removing electrodes or batteries If the electrodes lose stickiness add a drop of water to the electrodes after they are disconnected from the unit and place on plastic sheet. If you continued to have difficulty, call the TENS unit company to purchase more electrodes. Do not apply lotion on the skin area prior to use. Make sure the skin is clean and dry as this will help prolong the life of the electrodes. After use, always check skin for unusual red areas, rash or other skin difficulties. If there are any skin problems, does not apply electrodes to the same area. Never remove the electrodes from the unit by pulling the wires. Do not use the TENS unit or electrodes other than as directed. Do not change electrode placement without consultating your therapist or physician. Keep 2 fingers with between each electrode. Wear time ratio is 2:1, on to off times.    For example on for 30 minutes off for 15 minutes and then on for 30 minutes off for 15 minutes
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
## 2019-11-18 ENCOUNTER — Ambulatory Visit (INDEPENDENT_AMBULATORY_CARE_PROVIDER_SITE_OTHER): Payer: 59 | Admitting: Family Medicine

## 2019-11-18 ENCOUNTER — Encounter: Payer: Self-pay | Admitting: Family Medicine

## 2019-11-18 ENCOUNTER — Other Ambulatory Visit: Payer: Self-pay

## 2019-11-18 VITALS — BP 130/80 | HR 65 | Ht 60.0 in | Wt 141.4 lb

## 2019-11-18 DIAGNOSIS — M545 Low back pain, unspecified: Secondary | ICD-10-CM

## 2019-11-18 NOTE — Patient Instructions (Signed)
Thank you for coming in today. Attend PT.  Continue normal home activity.  OK to keep appt with Dr Tamala Julian on the 14th.  Next step is PT and then spinal manipulation if needed.   Come back or go to the emergency room if you notice new weakness new numbness problems walking or bowel or bladder problems.
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
## 2019-11-20 ENCOUNTER — Ambulatory Visit: Payer: 59 | Admitting: Physical Therapy

## 2019-11-25 ENCOUNTER — Ambulatory Visit (INDEPENDENT_AMBULATORY_CARE_PROVIDER_SITE_OTHER): Payer: 59 | Admitting: Physical Therapy

## 2019-11-25 ENCOUNTER — Encounter: Payer: Self-pay | Admitting: Physical Therapy

## 2019-11-25 ENCOUNTER — Other Ambulatory Visit: Payer: Self-pay

## 2019-11-25 DIAGNOSIS — M546 Pain in thoracic spine: Secondary | ICD-10-CM

## 2019-11-25 DIAGNOSIS — M542 Cervicalgia: Secondary | ICD-10-CM

## 2019-11-25 NOTE — Patient Instructions (Signed)
Access Code: [email or web address]  URL: [email or web address]  Date: 11/25/2019  Prepared by: Lyndee Hensen   Exercises Seated Cervical Sidebending Stretch - 3 reps - 30 hold - 3x daily Supine Lower Trunk Rotation - 10 reps - 2 sets - 2x daily Standing Sidebending with Chair Support - 3 reps - 30 hold - 3x daily Seated Scapular Retraction - 10 reps - 1 sets - 2x daily Standing Backward Shoulder Rolls - 10 reps - 2 sets - 2x daily

## 2019-11-26 ENCOUNTER — Encounter: Payer: Self-pay | Admitting: Physical Therapy

## 2019-11-26 NOTE — Addendum Note (Signed)
Addended by: Lyndee Hensen on: 11/26/2019 03:43 PM   Modules accepted: Orders
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
## 2019-11-27 MED FILL — BYSTOLIC 10 MG TABLET: 10 | 30 days supply | Qty: 30 | Fill #0

## 2019-11-28 ENCOUNTER — Ambulatory Visit: Payer: 59 | Admitting: Physical Therapy

## 2019-11-28 ENCOUNTER — Encounter: Payer: Self-pay | Admitting: Physical Therapy

## 2019-11-28 ENCOUNTER — Other Ambulatory Visit: Payer: Self-pay

## 2019-11-28 ENCOUNTER — Ambulatory Visit (INDEPENDENT_AMBULATORY_CARE_PROVIDER_SITE_OTHER): Payer: 59 | Admitting: Physical Therapy

## 2019-11-28 DIAGNOSIS — M542 Cervicalgia: Secondary | ICD-10-CM | POA: Diagnosis not present

## 2019-11-28 DIAGNOSIS — M546 Pain in thoracic spine: Secondary | ICD-10-CM

## 2019-12-01 ENCOUNTER — Encounter: Payer: Self-pay | Admitting: Physical Therapy

## 2019-12-01 NOTE — Therapy (Addendum)
[facility] [address] [place], [place], [address] Phone: [phone]   Fax:  [phone]  Physical Therapy Treatment  Patient Details  Name: Wendy [name] MRN: [id number] Date of Birth: 12/22/1980 Referring Provider (PT): Lynne Leader   Encounter Date: 11/28/2019  PT End of Session - 12/01/19 1434    Visit Number  2    Number of Visits  12    Date for PT Re-Evaluation  01/06/20    Authorization Type  UHC    PT Start Time  4917    PT Stop Time  1515    PT Time Calculation (min)  38 min    Activity Tolerance  Patient tolerated treatment well    Behavior During Therapy  [facility] for tasks assessed/performed       Past Medical History:  Diagnosis Date  . Abnormal Pap smear   . Anemia   . Bell's palsy   . Female infertility associated with anovulation   . Hx of varicella   . Hypertension   . Induction of Labor 10/28/2013  . Migraine   . Pregnancy induced hypertension    labetolol til    Past Surgical History:  Procedure Laterality Date  . COLPOSCOPY    . WISDOM TOOTH EXTRACTION      There were no vitals filed for this visit.  Subjective Assessment - 12/01/19 1433    Subjective  Pt states continued pain in L thoracic region, into R and L low back/SI region. Has been doing HEP.    Currently in Pain?  Yes    Pain Score  5     Pain Location  Thoracic    Pain Orientation  Left    Pain Descriptors / Indicators  Tightness    Pain Type  Acute pain    Pain Onset  1 to 4 weeks ago    Pain Frequency  Intermittent                       [facility] Adult PT Treatment/Exercise - 12/01/19 0001      Posture/Postural Control   Posture Comments  posterior: R hip slightly higher than L,       Exercises   Exercises  Neck;Lumbar      Neck Exercises: Seated   Shoulder Rolls  10 reps    Other Seated Exercise  Shoulder pulley x20       Lumbar Exercises: Aerobic   UBE (Upper Arm Bike)  L1 x3 min       Lumbar  Exercises: Standing   Row  20 reps    Theraband Level (Row)  Level 2 (Red)      Lumbar Exercises: Supine   Ab Set  20 reps    AB Set Limitations  with education on mechanics and breathing     Bent Knee Raise  20 reps      Manual Therapy   Manual Therapy  Soft tissue mobilization;Joint mobilization    Manual therapy comments  skilled palpation and monitoring of soft tissue with dry needling    Joint Mobilization  Thoracic PA mobs     Soft tissue mobilization  DTM/IASTM to L thoracic paraspinals and UT       Neck Exercises: Stretches   Upper Trapezius Stretch  2 reps;30 seconds       Trigger Point Dry Needling - 12/01/19 0001    Consent Given?  Yes    Education Handout Provided  Yes  Muscles Treated Head and Neck  Upper trapezius    Upper Trapezius Response  Twitch reponse elicited;Palpable increased muscle length   L            PT Short Term Goals - 11/26/19 1524      PT SHORT TERM GOAL #1   Title  Pt to be independent with initial HEP    Time  2    Period  Weeks    Status  New    Target Date  12/09/19      PT SHORT TERM GOAL #2   Title  Pt to report pain in thoracic region decreased to 5/10    Time  2    Period  Weeks    Status  New    Target Date  12/09/19        PT Long Term Goals - 11/26/19 1525      PT LONG TERM GOAL #1   Title  Pt to report decreased pain in back and neck to 0-2/10 with activity    Time  6    Period  Weeks    Status  New    Target Date  01/06/20      PT LONG TERM GOAL #2   Title  Pt to demo increased strength of core and hips to at least 4+/5, to improve stability and pain.    Time  6    Period  Weeks    Status  New    Target Date  01/06/20      PT LONG TERM GOAL #3   Title  Pt to demo ability for correct mechanics with bending, lifting, squat, with pain 0-2/10, to improve abilityfor work duties, and caring for children.    Time  6    Period  Weeks    Status  New    Target Date  01/06/20      PT LONG TERM GOAL #4    Title  Pt to be independent wtih final HEP    Time  6    Period  Weeks    Status  New    Target Date  01/06/20            Plan - 12/01/19 1438    Clinical Impression Statement  Pt with good tolerance for DN today, may require additonal needling for muscle pain and hypertonicity. Progressed ther ex today, with education on TA contraction and start of core strengthening. Pt with difficulty with this, and wil benefit from continued practice and progression of this.    Examination-Activity Limitations  Locomotion Level;Sit;Caring for Others;Sleep;Squat;Stairs;Stand;Lift    Examination-Participation Restrictions  Meal Prep;Cleaning;Community Activity;Driving;Shop;Laundry;Yard Work    Stability/Clinical Decision Making  Stable/Uncomplicated    Rehab Potential  Good    PT Frequency  2x / week    PT Duration  6 weeks    PT Treatment/Interventions  ADLs/Self Care Home Management;Electrical Stimulation;Iontophoresis 36m/ml Dexamethasone;Moist Heat;Traction;Ultrasound;Neuromuscular re-education;Balance training;Therapeutic exercise;Therapeutic activities;Functional mobility training;Stair training;Gait training;Patient/family education;Manual techniques;Dry needling;Passive range of motion;Taping;Spinal Manipulations;Joint Manipulations    PT Home Exercise Plan  [email or web address]    Consulted and Agree with Plan of Care  Patient       Patient will benefit from skilled therapeutic intervention in order to improve the following deficits and impairments:     Visit Diagnosis: Pain in thoracic spine  Cervicalgia     Problem List Patient Active Problem List   Diagnosis Date Noted  . Gestational hypertension affecting third pregnancy 05/25/2017  . SVD (spontaneous  vaginal delivery) 05/25/2017  . Postpartum care following vaginal delivery (6/7) 10/29/2013    Lyndee Hensen, PT, DPT 2:42 PM  12/01/19    [facility] [place], [place],  [address] Phone: [phone]   Fax:  [phone]  Name: MERCEDIES GANESH MRN: [id number] Date of Birth: 1981-08-19   PHYSICAL THERAPY DISCHARGE SUMMARY  Visits from Start of Care:2 Plan: Patient agrees to discharge.  Patient goals were partially met. Patient is being discharged due to not returning since the last visit.  ?????     Lyndee Hensen, PT, DPT 3:13 PM  05/04/20

## 2019-12-02 ENCOUNTER — Other Ambulatory Visit: Payer: Self-pay

## 2019-12-02 ENCOUNTER — Encounter: Payer: Self-pay | Admitting: Family Medicine

## 2019-12-02 ENCOUNTER — Ambulatory Visit (INDEPENDENT_AMBULATORY_CARE_PROVIDER_SITE_OTHER): Payer: 59 | Admitting: Family Medicine

## 2019-12-02 DIAGNOSIS — M999 Biomechanical lesion, unspecified: Secondary | ICD-10-CM

## 2019-12-02 DIAGNOSIS — S134XXD Sprain of ligaments of cervical spine, subsequent encounter: Secondary | ICD-10-CM | POA: Diagnosis not present

## 2019-12-02 DIAGNOSIS — S134XXA Sprain of ligaments of cervical spine, initial encounter: Secondary | ICD-10-CM | POA: Insufficient documentation

## 2019-12-02 MED ORDER — VITAMIN D (ERGOCALCIFEROL) 1.25 MG (50000 UNIT) PO CAPS: 50000.0000 [IU] | ORAL_CAPSULE | ORAL | 0 refills | Status: DC

## 2019-12-02 MED FILL — VIT D2 1.25 MG (50,000 UNIT: 1.25 MG | 28 days supply | Qty: 4 | Fill #0
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
## 2019-12-02 NOTE — Assessment & Plan Note (Signed)
Decision today to treat with OMT was based on Physical Exam  After verbal consent patient was treated with HVLA, ME, FPR techniques in cervical, thoracic, rib lumbar and sacral areas  Patient tolerated the procedure well with improvement in symptoms  Patient given exercises, stretches and lifestyle modifications  See medications in patient instructions if given  Patient will follow up in 4-8 weeks 

## 2019-12-02 NOTE — Patient Instructions (Addendum)
Good to see you.  Ice 20 minutes 2 times daily. Usually after activity and before bed. Exercises 3 times a week.  Once weekly vitamin D Hands in peripheral vision Tart cherry extract 1200mg  at night See me again in 4 weeks

## 2019-12-03 ENCOUNTER — Encounter: Payer: 59 | Admitting: Physical Therapy

## 2019-12-06 ENCOUNTER — Other Ambulatory Visit: Payer: Self-pay

## 2019-12-09 ENCOUNTER — Encounter: Payer: 59 | Admitting: Physical Therapy

## 2019-12-11 MED FILL — buPROPion HCL ER (XL) 150 M: 150 | 30 days supply | Qty: 30 | Fill #1

## 2019-12-11 MED FILL — HYDROCHLOROTHIAZIDE 12.5 MG: 12.5 | 30 days supply | Qty: 30 | Fill #1

## 2019-12-17 ENCOUNTER — Encounter: Payer: 59 | Admitting: Physical Therapy

## 2019-12-18 ENCOUNTER — Other Ambulatory Visit: Payer: Self-pay

## 2019-12-18 ENCOUNTER — Encounter: Payer: Self-pay | Admitting: Family Medicine

## 2019-12-18 ENCOUNTER — Ambulatory Visit (INDEPENDENT_AMBULATORY_CARE_PROVIDER_SITE_OTHER): Payer: 59 | Admitting: Family Medicine

## 2019-12-18 VITALS — BP 110/70 | HR 71 | Ht 60.0 in | Wt 140.0 lb

## 2019-12-18 DIAGNOSIS — S134XXD Sprain of ligaments of cervical spine, subsequent encounter: Secondary | ICD-10-CM | POA: Diagnosis not present

## 2019-12-18 DIAGNOSIS — M999 Biomechanical lesion, unspecified: Secondary | ICD-10-CM | POA: Diagnosis not present

## 2019-12-18 NOTE — Assessment & Plan Note (Signed)
Decision today to treat with OMT was based on Physical Exam  After verbal consent patient was treated with HVLA, ME, FPR techniques in cervical, thoracic, rib areas  Patient tolerated the procedure well with improvement in symptoms  Patient given exercises, stretches and lifestyle modifications  See medications in patient instructions if given  Patient will follow up in 6-8 weeks
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
## 2019-12-18 NOTE — Patient Instructions (Signed)
See me in 6 weeks

## 2020-01-10 MED FILL — buPROPion HCL ER (XL) 150 M: 150 | 30 days supply | Qty: 30 | Fill #2

## 2020-01-10 MED FILL — HYDROCHLOROTHIAZIDE 12.5 MG: 12.5 | 30 days supply | Qty: 30 | Fill #2

## 2020-01-29 ENCOUNTER — Ambulatory Visit: Payer: 59 | Admitting: Family Medicine

## 2020-01-29 ENCOUNTER — Other Ambulatory Visit: Payer: Self-pay

## 2020-01-29 ENCOUNTER — Encounter: Payer: Self-pay | Admitting: Family Medicine

## 2020-01-29 DIAGNOSIS — F411 Generalized anxiety disorder: Secondary | ICD-10-CM

## 2020-01-29 DIAGNOSIS — M999 Biomechanical lesion, unspecified: Secondary | ICD-10-CM | POA: Diagnosis not present

## 2020-01-29 DIAGNOSIS — S134XXD Sprain of ligaments of cervical spine, subsequent encounter: Secondary | ICD-10-CM | POA: Diagnosis not present
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
## 2020-01-29 NOTE — Assessment & Plan Note (Signed)
Decision today to treat with OMT was based on Physical Exam  After verbal consent patient was treated with HVLA, ME, FPR techniques in cervical, thoracic, rib,  areas  Patient tolerated the procedure well with improvement in symptoms  Patient given exercises, stretches and lifestyle modifications  See medications in patient instructions if given  Patient will follow up in 4 weeks 

## 2020-01-29 NOTE — Patient Instructions (Signed)
Effexor 37.5 daily  Will order MRI for husband See me again in 4 weeks

## 2020-02-04 ENCOUNTER — Telehealth: Payer: Self-pay | Admitting: Family Medicine

## 2020-02-04 NOTE — Telephone Encounter (Signed)
Patient called stating that she would like to discuss the medication changes that were recommended to her by Dr Tamala Julian. She has been feeling nauseated and did not know if she needed to do something different.

## 2020-02-04 NOTE — Telephone Encounter (Signed)
Stop for a week then restart, if nausea come back then likely the medicine can take with food  If still a problem then stop

## 2020-02-05 NOTE — Telephone Encounter (Signed)
Patient states that she is on Wellbutrin as well as Effexor. Feels that this is too much at one time and is causing the nausea. Is she ok to stop the Wellbutrin and just use the Effexor?

## 2020-02-05 NOTE — Telephone Encounter (Signed)
Per a verbal from Dr. Tamala Julian, patient can stop Wellbutrin and start Effexor. Patient notified and will call if any issues.

## 2020-03-04 ENCOUNTER — Other Ambulatory Visit: Payer: Self-pay

## 2020-03-04 ENCOUNTER — Encounter: Payer: Self-pay | Admitting: Family Medicine

## 2020-03-04 ENCOUNTER — Ambulatory Visit: Payer: 59 | Admitting: Family Medicine

## 2020-03-04 VITALS — BP 100/70 | HR 67 | Ht 60.0 in | Wt 143.0 lb

## 2020-03-04 DIAGNOSIS — M999 Biomechanical lesion, unspecified: Secondary | ICD-10-CM

## 2020-03-04 DIAGNOSIS — S134XXD Sprain of ligaments of cervical spine, subsequent encounter: Secondary | ICD-10-CM | POA: Diagnosis not present

## 2020-03-04 DIAGNOSIS — M25511 Pain in right shoulder: Secondary | ICD-10-CM | POA: Diagnosis not present

## 2020-03-04 MED ORDER — VENLAFAXINE HCL ER 75 MG PO CP24: 75.0000 mg | ORAL_CAPSULE | Freq: Every day | ORAL | 0 refills | Status: DC

## 2020-03-04 MED FILL — VENLAFAXINE HCL ER 75 MG CA: 75 | 30 days supply | Qty: 30 | Fill #0

## 2020-03-04 NOTE — Patient Instructions (Addendum)
Trigger points today See me again in 4-6 weeks
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
## 2020-03-05 ENCOUNTER — Encounter: Payer: Self-pay | Admitting: Family Medicine

## 2020-03-05 DIAGNOSIS — M25511 Pain in right shoulder: Secondary | ICD-10-CM | POA: Insufficient documentation

## 2020-03-05 NOTE — Assessment & Plan Note (Signed)
Patient continues to plateau with some of the pain.  Trying some trigger point injections.  Did have fairly good response.  Discussed icing regimen and home exercise, discussed which activities to doing which wants to avoid.  Patient should increase activity slowly.  Follow-up again 4 to 8 weeks

## 2020-03-05 NOTE — Assessment & Plan Note (Signed)
Decision today to treat with OMT was based on Physical Exam  After verbal consent patient was treated with HVLA, ME, FPR techniques in cervical, thoracic, rib,  areas  Patient tolerated the procedure well with improvement in symptoms  Patient given exercises, stretches and lifestyle modifications  See medications in patient instructions if given  Patient will follow up in 4-8 weeks 

## 2020-03-05 NOTE — Assessment & Plan Note (Signed)
Continues to make improvement.  Continues to have a dull, throbbing aching sensation overall.  Patient does respond fairly well though to osteopathic manipulation.  Increased Effexor to 75 mg.  Follow-up again in 4 to 8 weeks

## 2020-04-01 ENCOUNTER — Ambulatory Visit: Payer: 59 | Admitting: Family Medicine

## 2020-04-01 ENCOUNTER — Other Ambulatory Visit: Payer: Self-pay

## 2020-04-01 ENCOUNTER — Encounter: Payer: Self-pay | Admitting: Family Medicine

## 2020-04-01 ENCOUNTER — Other Ambulatory Visit: Payer: Self-pay | Admitting: Family Medicine

## 2020-04-01 VITALS — BP 104/80 | HR 74 | Ht 60.0 in | Wt 141.0 lb

## 2020-04-01 DIAGNOSIS — M999 Biomechanical lesion, unspecified: Secondary | ICD-10-CM

## 2020-04-01 DIAGNOSIS — S134XXD Sprain of ligaments of cervical spine, subsequent encounter: Secondary | ICD-10-CM | POA: Diagnosis not present

## 2020-04-01 MED ORDER — VENLAFAXINE HCL ER 75 MG PO CP24: 75.0000 mg | ORAL_CAPSULE | Freq: Every day | ORAL | 3 refills | Status: DC

## 2020-04-01 MED FILL — VENLAFAXINE HCL ER 75 MG CA: 75 | 30 days supply | Qty: 30 | Fill #0

## 2020-04-01 NOTE — Assessment & Plan Note (Signed)
   Decision today to treat with OMT was based on Physical Exam  After verbal consent patient was treated with HVLA, ME, FPR techniques in cervical, thoracic, rib,areas, all areas are chronic   Patient tolerated the procedure well with improvement in symptoms  Patient given exercises, stretches and lifestyle modifications  See medications in patient instructions if given  Patient will follow up in 4-8 weeks 

## 2020-04-01 NOTE — Patient Instructions (Addendum)
Happy you are doing better See me again in 2 months

## 2020-04-01 NOTE — Progress Notes (Signed)
[facility] [address] [place] Phone: [phone] Subjective:   I Wendy [name] am serving as a Education administrator for Dr. Hulan Saas.  This visit occurred during the SARS-CoV-2 public health emergency.  Safety protocols were in place, including screening questions prior to the visit, additional usage of staff PPE, and extensive cleaning of exam room while observing appropriate contact time as indicated for disinfecting solutions.   I'm seeing this patient by the request  of:  Brien Few, MD  CC: Neck pain and headache follow-up  [id number]   Wendy [name] is a 39 y.o. female coming in with complaint of back pain. Last seen on 03/04/2020 for OMT. Patient states she is doing well.  Patient is doing relatively well overall.  Discussed which activities to do which was to avoid.  Patient says increase her Effexor to 75 mg and feels like it has made significant improvement.  Patient denies any numbness or tingling, any radiation of the pain at the moment.     Past Medical History:  Diagnosis Date  . Abnormal Pap smear   . Anemia   . Bell's palsy   . Female infertility associated with anovulation   . Hx of varicella   . Hypertension   . Induction of Labor 10/28/2013  . Migraine   . Pregnancy induced hypertension    labetolol til   Past Surgical History:  Procedure Laterality Date  . COLPOSCOPY    . WISDOM TOOTH EXTRACTION     Social History   Socioeconomic History  . Marital status: Married    Spouse name: Not on file  . Number of children: Not on file  . Years of education: Not on file  . Highest education level: Not on file  Occupational History  . Not on file  Tobacco Use  . Smoking status: Never Smoker  . Smokeless tobacco: Never Used  Substance and Sexual Activity  . Alcohol use: No  . Drug use: No  . Sexual activity: Never    Birth control/protection: None  Other Topics Concern  . Not on file  Social History  Narrative  . Not on file   Social Determinants of Health   Financial Resource Strain:   . Difficulty of Paying Living Expenses:   Food Insecurity:   . Worried About Charity fundraiser in the Last Year:   . Arboriculturist in the Last Year:   Transportation Needs:   . Film/video editor (Medical):   Marland Kitchen Lack of Transportation (Non-Medical):   Physical Activity:   . Days of Exercise per Week:   . Minutes of Exercise per Session:   Stress:   . Feeling of Stress :   Social Connections:   . Frequency of Communication with Friends and Family:   . Frequency of Social Gatherings with Friends and Family:   . Attends Religious Services:   . Active Member of Clubs or Organizations:   . Attends Archivist Meetings:   Marland Kitchen Marital Status:    No Known Allergies Family History  Problem Relation Age of Onset  . Hypertension Mother   . Diabetes Mother   . Stroke Maternal Grandfather   . Hypertension Maternal Grandfather   . Diabetes Maternal Grandfather   . Heart disease Paternal Grandmother   . Kidney disease Paternal Grandmother   . Heart disease Paternal Grandfather   . Heart attack Paternal Grandfather      Current Outpatient Medications (Cardiovascular):  .  hydrochlorothiazide (HYDRODIURIL) 25 MG tablet, Take 1 tablet (25 mg total) by mouth daily.   Current Outpatient Medications (Analgesics):  .  acetaminophen (TYLENOL) 500 MG tablet, Take 500 mg by mouth every 6 (six) hours as needed. Marland Kitchen  ibuprofen (ADVIL,MOTRIN) 600 MG tablet, Take 1 tablet (600 mg total) by mouth every 6 (six) hours.   Current Outpatient Medications (Other):  Marland Kitchen  buPROPion (WELLBUTRIN XL) 150 MG 24 hr tablet, Take 1 tablet (150 mg total) by mouth daily. Marland Kitchen  venlafaxine XR (EFFEXOR XR) 75 MG 24 hr capsule, Take 1 capsule (75 mg total) by mouth daily with breakfast. .  Vitamin D, Ergocalciferol, (DRISDOL) 1.25 MG (50000 UT) CAPS capsule, Take 1 capsule (50,000 Units total) by mouth every 7 (seven)  days. Marland Kitchen  venlafaxine XR (EFFEXOR XR) 75 MG 24 hr capsule, Take 1 capsule (75 mg total) by mouth daily with breakfast.   Reviewed prior external information including notes and imaging from  primary care provider As well as notes that were available from care everywhere and other healthcare systems.  Past medical history, social, surgical and family history all reviewed in electronic medical record.  No pertanent information unless stated regarding to the chief complaint.   Review of Systems:  No  visual changes, nausea, vomiting, diarrhea, constipation, dizziness, abdominal pain, skin rash, fevers, chills, night sweats, weight loss, swollen lymph nodes, body aches, joint swelling, chest pain, shortness of breath, mood changes. POSITIVE muscle aches, mild headaches  Objective  Blood pressure 104/80, pulse 74, height 5' (1.524 m), weight 141 lb (64 kg), SpO2 98 %, unknown if currently breastfeeding.   General: No apparent distress alert and oriented x3 mood and affect normal, dressed appropriately.  HEENT: Pupils equal, extraocular movements intact  Respiratory: Patient's speak in full sentences and does not appear short of breath  Cardiovascular: No lower extremity edema, non tender, no erythema  Neuro: Cranial nerves II through XII are intact, neurovascularly intact in all extremities with 2+ DTRs and 2+ pulses.  Gait normal with good balance and coordination.  MSK:  tender with full range of motion and good stability and symmetric strength and tone of shoulders, elbows, wrist, hip, knee and ankles bilaterally.  Neck exam does have some mild loss of lordosis.  Patient does have some tightness noted on more on the left side of the parascapular region than the right today.  A little different than usual.  Osteopathic findings C4 flexed rotated and side bent left C6 flexed rotated and side bent left T3 extended rotated and side bent left inhaled third rib T9 extended rotated and side bent  left    Impression and Recommendations:     This case required medical decision making of moderate complexity. The above documentation has been reviewed and is accurate and complete Lyndal Pulley, DO       Note: This dictation was prepared with Dragon dictation along with smaller phrase technology. Any transcriptional errors that result from this process are unintentional.

## 2020-04-01 NOTE — Assessment & Plan Note (Signed)
Patient's family seemed to make some improvement at this time.  Doing better with the Effexor discussed which activities to do which wants to avoid.  Patient responding well to manipulation.  Follow-up again in 2 months

## 2020-05-13 MED FILL — VENLAFAXINE HCL ER 75 MG CA: 75 | 30 days supply | Qty: 30 | Fill #1

## 2020-05-27 ENCOUNTER — Ambulatory Visit: Payer: 59 | Admitting: Family Medicine

## 2020-05-27 ENCOUNTER — Encounter: Payer: Self-pay | Admitting: Family Medicine

## 2020-05-27 ENCOUNTER — Other Ambulatory Visit: Payer: Self-pay

## 2020-05-27 DIAGNOSIS — F411 Generalized anxiety disorder: Secondary | ICD-10-CM

## 2020-05-27 DIAGNOSIS — S134XXD Sprain of ligaments of cervical spine, subsequent encounter: Secondary | ICD-10-CM

## 2020-05-27 DIAGNOSIS — M999 Biomechanical lesion, unspecified: Secondary | ICD-10-CM | POA: Diagnosis not present

## 2020-05-27 NOTE — Progress Notes (Signed)
[facility] Wendy [name] Phone: [phone] Subjective:   Wendy Wendy [name], am serving as a scribe for Dr. Hulan [name]. This visit occurred during the SARS-CoV-2 public health emergency.  Safety protocols were in place, including screening questions prior to the visit, additional usage of staff PPE, and extensive cleaning of exam room while observing appropriate contact time as indicated for disinfecting solutions.   I'm seeing this patient by the request  of:  Wendy Few, MD  CC: Neck pain and upper back pain follow-up  [id number]  Wendy Wendy [name] is a 39 y.o. female coming in with complaint of back and neck pain. OMT 04/01/2020. Patient states that overall she has been doing well. Patient has been able to be more active. Feels like the decreasing amount of work and stress has been also very helpful. Making progress and feels like she is almost herself at this time  Medications patient has been prescribed:   Taking:         Reviewed prior external information including notes and imaging from previsou exam, outside providers and external EMR if available.   As well as notes that were available from care everywhere and other healthcare systems.  Past medical history, social, surgical and family history all reviewed in electronic medical record.  Wendy [name] pertanent information unless stated regarding to the chief complaint.   Past Medical History:  Diagnosis Date  . Abnormal Pap smear   . Anemia   . Bell's palsy   . Female infertility associated with anovulation   . Hx of varicella   . Hypertension   . Induction of Labor 10/28/2013  . Migraine   . Pregnancy induced hypertension    labetolol til    Wendy [name] Known Allergies   Review of Systems:  Wendy [name] headache, visual changes, nausea, vomiting, diarrhea, constipation, dizziness, abdominal pain, skin rash, fevers, chills, night sweats, weight loss, swollen lymph nodes,  body aches, joint swelling, chest pain, shortness of breath, mood changes. POSITIVE muscle aches  Objective  unknown if currently breastfeeding.   General: Wendy [name] apparent distress alert and oriented x3 mood and affect normal, dressed appropriately.  HEENT: Pupils equal, extraocular movements intact  Respiratory: Patient's speak in full sentences and does not appear short of breath  Cardiovascular: Wendy [name] lower extremity edema, non tender, Wendy [name] erythema  Neuro: Cranial nerves II through XII are intact, neurovascularly intact in all extremities with 2+ DTRs and 2+ pulses.  Gait normal with good balance and coordination.  MSK:  Non tender with full range of motion and good stability and symmetric strength and tone of shoulders, elbows, wrist, hip, knee and ankles bilaterally.  Neck exam still shows the patient does have some tenderness to palpation more in the parascapular region left greater than right now. Wendy [name] trigger points noted on the right side at the moment. Negative Spurling's.  Osteopathic findings  C2 flexed rotated and side bent right C6 flexed rotated and side bent left T3 extended rotated and side bent left inhaled rib T9 extended rotated and side bent left        Assessment and Plan:    Nonallopathic problems  Decision today to treat with OMT was based on Physical Exam  After verbal consent patient was treated with HVLA, ME, FPR techniques in cervical, rib, thoracic, lumbar, and sacral  areas  Patient tolerated the procedure well with improvement in symptoms  Patient given exercises, stretches and lifestyle modifications  See medications in patient instructions  if given  Patient will follow up in 4-8 weeks      The above documentation has been reviewed and is accurate and complete Wendy Pulley, DO       Note: This dictation was prepared with Dragon dictation along with smaller phrase technology. Any transcriptional errors that result from this process are  unintentional.

## 2020-05-27 NOTE — Assessment & Plan Note (Signed)
Continue the Effexor same dose.

## 2020-05-27 NOTE — Assessment & Plan Note (Signed)
Significant improvement overall. Discussed with patient to continue to work on the ergonomics. Patient will continue with multivitamin to supplement generalized anxiety disorder. Discussed some over-the-counter medications that could be beneficial. Follow-up again in 4 to 8 weeks

## 2020-05-27 NOTE — Patient Instructions (Signed)
Nexium daily for 2 weeks Try the new all exercise See me in 10 weeks

## 2020-06-11 MED FILL — VENLAFAXINE HCL ER 75 MG CA: 75 | 30 days supply | Qty: 30 | Fill #2

## 2020-07-10 MED FILL — VENLAFAXINE HCL ER 75 MG CA: 75 | 30 days supply | Qty: 30 | Fill #3

## 2020-08-07 ENCOUNTER — Ambulatory Visit: Payer: 59 | Admitting: Family Medicine

## 2020-08-07 NOTE — Progress Notes (Deleted)
  [place] Phone: [phone] Subjective:    I'm seeing this patient by the request  of:  Brien Few, MD  CC:   [id number]  Wendy [name] is a 39 y.o. female coming in with complaint of back and neck pain. OMT 05/27/2020. Patient states   Medications patient has been prescribed:   Taking:         Reviewed prior external information including notes and imaging from previsou exam, outside providers and external EMR if available.   As well as notes that were available from care everywhere and other healthcare systems.  Past medical history, social, surgical and family history all reviewed in electronic medical record.  No pertanent information unless stated regarding to the chief complaint.   Past Medical History:  Diagnosis Date  . Abnormal Pap smear   . Anemia   . Bell's palsy   . Female infertility associated with anovulation   . Hx of varicella   . Hypertension   . Induction of Labor 10/28/2013  . Migraine   . Pregnancy induced hypertension    labetolol til    No Known Allergies   Review of Systems:  No headache, visual changes, nausea, vomiting, diarrhea, constipation, dizziness, abdominal pain, skin rash, fevers, chills, night sweats, weight loss, swollen lymph nodes, body aches, joint swelling, chest pain, shortness of breath, mood changes. POSITIVE muscle aches  Objective  unknown if currently breastfeeding.   General: No apparent distress alert and oriented x3 mood and affect normal, dressed appropriately.  HEENT: Pupils equal, extraocular movements intact  Respiratory: Patient's speak in full sentences and does not appear short of breath  Cardiovascular: No lower extremity edema, non tender, no erythema  Neuro: Cranial nerves II through XII are intact, neurovascularly intact in all extremities with 2+ DTRs and 2+ pulses.  Gait normal with good balance and coordination.    MSK:  Non tender with full range of motion and good stability and symmetric strength and tone of shoulders, elbows, wrist, hip, knee and ankles bilaterally.  Back - Normal skin, Spine with normal alignment and no deformity.  No tenderness to vertebral process palpation.  Paraspinous muscles are not tender and without spasm.   Range of motion is full at neck and lumbar sacral regions  Osteopathic findings  C2 flexed rotated and side bent right C6 flexed rotated and side bent left T3 extended rotated and side bent right inhaled rib T9 extended rotated and side bent left L2 flexed rotated and side bent right Sacrum right on right       Assessment and Plan:    Nonallopathic problems  Decision today to treat with OMT was based on Physical Exam  After verbal consent patient was treated with HVLA, ME, FPR techniques in cervical, rib, thoracic, lumbar, and sacral  areas  Patient tolerated the procedure well with improvement in symptoms  Patient given exercises, stretches and lifestyle modifications  See medications in patient instructions if given  Patient will follow up in 4-8 weeks      The above documentation has been reviewed and is accurate and complete Jacqualin Combes       Note: This dictation was prepared with Dragon dictation along with smaller phrase technology. Any transcriptional errors that result from this process are unintentional.

## 2020-08-20 MED FILL — VENLAFAXINE HCL ER 75 MG CA: 75 | 30 days supply | Qty: 30 | Fill #4

## 2020-08-24 NOTE — Progress Notes (Signed)
  [place] [address] [place] Phone: [phone] Subjective:    I'm seeing this patient by the request  of:  Brien Few, MD  CC: Neck and back pain follow-up

## 2020-08-25 ENCOUNTER — Ambulatory Visit: Payer: 59 | Admitting: Family Medicine

## 2020-09-21 MED FILL — VENLAFAXINE HCL ER 75 MG CA: 75 | 30 days supply | Qty: 30 | Fill #5

## 2020-09-28 ENCOUNTER — Ambulatory Visit: Payer: 59 | Admitting: Family Medicine

## 2020-09-28 ENCOUNTER — Other Ambulatory Visit: Payer: Self-pay

## 2020-09-28 ENCOUNTER — Encounter: Payer: Self-pay | Admitting: Family Medicine

## 2020-09-28 VITALS — Wt 141.0 lb

## 2020-09-28 DIAGNOSIS — S134XXD Sprain of ligaments of cervical spine, subsequent encounter: Secondary | ICD-10-CM | POA: Diagnosis not present

## 2020-09-28 DIAGNOSIS — M999 Biomechanical lesion, unspecified: Secondary | ICD-10-CM | POA: Diagnosis not present

## 2020-09-28 NOTE — Progress Notes (Signed)
[place] Phone: [phone] Subjective:    I'm seeing this patient by the request  of:  Brien Few, MD  CC: Neck pain follow-up  [id number]  Wendy [name] is a 39 y.o. female coming in with complaint of back and neck pain. OMT 05/27/2020. Patient states has been doing relatively well but does have some tightness.  Some increase in anxiety.  Having some family problems and has transition to work.  Is working on recovery.  Medications patient has been prescribed: Effexor, Vitamin D  Taking: Yes         Reviewed prior external information including notes and imaging from previsou exam, outside providers and external EMR if available.   As well as notes that were available from care everywhere and other healthcare systems.  Past medical history, social, surgical and family history all reviewed in electronic medical record.  No pertanent information unless stated regarding to the chief complaint.   Past Medical History:  Diagnosis Date  . Abnormal Pap smear   . Anemia   . Bell's palsy   . Female infertility associated with anovulation   . Hx of varicella   . Hypertension   . Induction of Labor 10/28/2013  . Migraine   . Pregnancy induced hypertension    labetolol til    No Known Allergies   Review of Systems:  No headache, visual changes, nausea, vomiting, diarrhea, constipation, dizziness, abdominal pain, skin rash, fevers, chills, night sweats, weight loss, swollen lymph nodes, body aches, joint swelling, chest pain, shortness of breath, mood changes. POSITIVE muscle aches  Objective  unknown if currently breastfeeding.   General: Minorly anxious and tearful HEENT: Pupils equal, extraocular movements intact  Respiratory: Patient's speak in full sentences and does not appear short of breath  Cardiovascular: No lower extremity edema, non tender, no erythema  Neuro: Cranial nerves II  through XII are intact, neurovascularly intact in all extremities with 2+ DTRs and 2+ pulses.  Gait normal with good balance and coordination.  MSK:  Non tender with full range of motion and good stability and symmetric strength and tone of shoulders, elbows, wrist, hip, knee and ankles bilaterally.  Neck exam shows the patient does have some loss of lordosis, some tenderness to palpation in the parascapular region as well right greater than left.  Patient has some mild decrease in sidebending of the neck bilaterally.  5 out of 5 strength throughout the upper extremities.  Osteopathic findings  C2 flexed rotated and side bent right C6 flexed rotated and side bent left T4 extended rotated and side bent right inhaled rib        Assessment and Plan:  Whiplash injuries Continue mild tightness and I do think that underlying anxiety is now playing a little bit of a role as well.  Continue with the Effexor at this time.  Discussed posture and ergonomics changes for patient as well as working environment.  Follow-up with me again in 4 to 8 weeks     Nonallopathic problems  Decision today to treat with OMT was based on Physical Exam  After verbal consent patient was treated with HVLA, ME, FPR techniques in cervical, rib, thoracic areas  Patient tolerated the procedure well with improvement in symptoms  Patient given exercises, stretches and lifestyle modifications  See medications in patient instructions if given  Patient will follow up in 4-8 weeks      The above documentation has been reviewed  and is accurate and complete Wendy Pulley, DO       Note: This dictation was prepared with Dragon dictation along with smaller phrase technology. Any transcriptional errors that result from this process are unintentional.

## 2020-09-28 NOTE — Assessment & Plan Note (Signed)
Continue mild tightness and I do think that underlying anxiety is now playing a little bit of a role as well.  Continue with the Effexor at this time.  Discussed posture and ergonomics changes for patient as well as working environment.  Follow-up with me again in 4 to 8 weeks

## 2020-10-20 MED FILL — VENLAFAXINE HCL ER 75 MG CA: 75 | 30 days supply | Qty: 30 | Fill #6

## 2020-11-18 ENCOUNTER — Other Ambulatory Visit: Payer: Self-pay

## 2020-11-18 ENCOUNTER — Encounter: Payer: Self-pay | Admitting: Family Medicine

## 2020-11-18 ENCOUNTER — Ambulatory Visit: Payer: 59 | Admitting: Family Medicine

## 2020-11-18 VITALS — BP 120/84 | HR 64 | Ht 60.0 in | Wt 143.0 lb

## 2020-11-18 DIAGNOSIS — S134XXD Sprain of ligaments of cervical spine, subsequent encounter: Secondary | ICD-10-CM | POA: Diagnosis not present

## 2020-11-18 DIAGNOSIS — M999 Biomechanical lesion, unspecified: Secondary | ICD-10-CM

## 2020-11-18 NOTE — Assessment & Plan Note (Signed)
Patient has been significant strides in maximum medical improvement.  At this point patient continues to respond well still respond well to 24-month intervals for osteopathic manipulation.  Doing well with the medications and no change with patient at this moment needed.  Follow-up with me again in 3 months

## 2020-11-18 NOTE — Patient Instructions (Addendum)
You are awesome Happy Holidays  See me in 2 months

## 2020-11-18 NOTE — Progress Notes (Signed)
[facility] [place] Phone: [phone] Subjective:   Wendy [name], am serving as a scribe for Dr. Hulan Saas. This visit occurred during the SARS-CoV-2 public health emergency.  Safety protocols were in place, including screening questions prior to the visit, additional usage of staff PPE, and extensive cleaning of exam room while observing appropriate contact time as indicated for disinfecting solutions.   I'm seeing this patient by the request  of:  Brien Few, MD  CC: Neck and back pain follow-up  [id number]  Anuja Manka Cottam is a 39 y.o. female coming in with complaint of back and neck pain. OMT 09/28/2020. Patient states that she feels the way she always feels when ready for manipulation.  Medications patient has been prescribed: Effexor, Vit D  Taking: Intermittently         Reviewed prior external information including notes and imaging from previsou exam, outside providers and external EMR if available.   As well as notes that were available from care everywhere and other healthcare systems.  Past medical history, social, surgical and family history all reviewed in electronic medical record.  No pertanent information unless stated regarding to the chief complaint.   Past Medical History:  Diagnosis Date  . Abnormal Pap smear   . Anemia   . Bell's palsy   . Female infertility associated with anovulation   . Hx of varicella   . Hypertension   . Induction of Labor 10/28/2013  . Migraine   . Pregnancy induced hypertension    labetolol til    No Known Allergies   Review of Systems:  No headache, visual changes, nausea, vomiting, diarrhea, constipation, dizziness, abdominal pain, skin rash, fevers, chills, night sweats, weight loss, swollen lymph nodes, body aches, joint swelling, chest pain, shortness of breath, mood changes. POSITIVE muscle aches  Objective  Blood pressure 120/84,  pulse 64, height 5' (1.524 m), weight 143 lb (64.9 kg), SpO2 99 %, unknown if currently breastfeeding.   General: No apparent distress alert and oriented x3 mood and affect normal, dressed appropriately.  HEENT: Pupils equal, extraocular movements intact  Respiratory: Patient's speak in full sentences and does not appear short of breath  Cardiovascular: No lower extremity edema, non tender, no erythema  Neuro: Cranial nerves II through XII are intact, neurovascularly intact in all extremities with 2+ DTRs and 2+ pulses.  Gait normal with good balance and coordination.  MSK:  Non tender with full range of motion and good stability and symmetric strength and tone of shoulders, elbows, wrist, hip, knee and ankles bilaterally.  Back - Normal skin, Spine with normal alignment and no deformity.  No tenderness to vertebral process palpation.  Paraspinous muscles are not tender and without spasm.   Range of motion is full at neck and lumbar sacral regions  Osteopathic findings  C2 flexed rotated and side bent right C6 flexed rotated and side bent left T4 extended rotated and side bent right inhaled rib        Assessment and Plan:  Whiplash injuries Patient has been significant strides in maximum medical improvement.  At this point patient continues to respond well still respond well to 52-month intervals for osteopathic manipulation.  Doing well with the medications and no change with patient at this moment needed.  Follow-up with me again in 3 months    Nonallopathic problems  Decision today to treat with OMT was based on Physical Exam  After verbal consent patient  was treated with HVLA, ME, FPR techniques in cervical, rib, thoracic,  areas  Patient tolerated the procedure well with improvement in symptoms  Patient given exercises, stretches and lifestyle modifications  See medications in patient instructions if given  Patient will follow up in 4-8 weeks      The above  documentation has been reviewed and is accurate and complete Lyndal Pulley, DO        Note: This dictation was prepared with Dragon dictation along with smaller phrase technology. Any transcriptional errors that result from this process are unintentional.

## 2020-11-30 MED FILL — VENLAFAXINE HCL ER 75 MG CA: 75 | 30 days supply | Qty: 30 | Fill #7

## 2020-12-31 MED FILL — VENLAFAXINE HCL ER 75 MG CA: 75 | 30 days supply | Qty: 30 | Fill #8

## 2021-01-13 ENCOUNTER — Ambulatory Visit: Payer: 59 | Admitting: Family Medicine
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
## 2021-01-25 ENCOUNTER — Ambulatory Visit (INDEPENDENT_AMBULATORY_CARE_PROVIDER_SITE_OTHER): Payer: 59

## 2021-01-25 ENCOUNTER — Encounter: Payer: Self-pay | Admitting: Family Medicine

## 2021-01-25 ENCOUNTER — Ambulatory Visit: Payer: 59 | Admitting: Family Medicine

## 2021-01-25 ENCOUNTER — Other Ambulatory Visit: Payer: Self-pay | Admitting: Family Medicine

## 2021-01-25 ENCOUNTER — Other Ambulatory Visit: Payer: Self-pay

## 2021-01-25 VITALS — BP 132/90 | HR 75 | Ht 60.0 in | Wt 140.0 lb

## 2021-01-25 DIAGNOSIS — M999 Biomechanical lesion, unspecified: Secondary | ICD-10-CM

## 2021-01-25 DIAGNOSIS — R079 Chest pain, unspecified: Secondary | ICD-10-CM

## 2021-01-25 DIAGNOSIS — B948 Sequelae of other specified infectious and parasitic diseases: Secondary | ICD-10-CM | POA: Diagnosis not present

## 2021-01-25 DIAGNOSIS — S134XXD Sprain of ligaments of cervical spine, subsequent encounter: Secondary | ICD-10-CM | POA: Diagnosis not present

## 2021-01-25 MED ORDER — PREDNISONE 20 MG PO TABS: ORAL_TABLET | ORAL | 0 refills | Status: DC

## 2021-01-25 MED ORDER — DOXYCYCLINE HYCLATE 100 MG PO TABS: 100.0000 mg | ORAL_TABLET | Freq: Two times a day (BID) | ORAL | 0 refills | Status: DC

## 2021-01-25 MED FILL — DOXYCYCLINE HYCLATE 100 MG: 100 | 7 days supply | Qty: 14 | Fill #0

## 2021-01-25 MED FILL — predniSONE 20 MG TABS: 20 | 5 days supply | Qty: 10 | Fill #0

## 2021-01-25 NOTE — Assessment & Plan Note (Signed)
Questionable mild reactive airway.  Patient will be given a short course of prednisone.  We discussed different signs for pulmonary embolism but with it being some intermittent not concerned especially no lower extremity edema.  Discussed which activities to do which wants to avoid discussed when to potentially seek medical attention.  Patient will be given a short course of prednisone as well as doxycycline.  Follow-up with me again in 4 -8 weeks

## 2021-01-25 NOTE — Patient Instructions (Addendum)
Good to see you Prednisone 20mg  once a day for  5 days If shortness of breath gets worse seek attention immediately  Doxycycline 100mg  twice a day 7 days  I do feel your back and neck has reached your base line and are at your maximum Medical care.  See me again in 2 months
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
## 2021-02-03 MED FILL — VENLAFAXINE HCL ER 75 MG CA: 75 | 30 days supply | Qty: 30 | Fill #9

## 2021-03-03 ENCOUNTER — Other Ambulatory Visit (HOSPITAL_COMMUNITY): Payer: Self-pay | Admitting: Family Medicine

## 2021-03-10 MED FILL — VENLAFAXINE HCL ER 75 MG CA: 75 | 30 days supply | Qty: 30 | Fill #10

## 2021-03-19 NOTE — Progress Notes (Deleted)
  [place] Phone: [phone] Subjective:    I'm seeing this patient by the request  of:  Brien Few, MD  CC: back and neck pain follow up   [id number]  Wendy [name] is a 40 y.o. female coming in with complaint of back and neck pain. OMT 01/25/2021. Patient states   Medications patient has been prescribed: Prednisone  Taking:         Reviewed prior external information including notes and imaging from previsou exam, outside providers and external EMR if available.   As well as notes that were available from care everywhere and other healthcare systems.  Past medical history, social, surgical and family history all reviewed in electronic medical record.  No pertanent information unless stated regarding to the chief complaint.   Past Medical History:  Diagnosis Date  . Abnormal Pap smear   . Anemia   . Bell's palsy   . Female infertility associated with anovulation   . Hx of varicella   . Hypertension   . Induction of Labor 10/28/2013  . Migraine   . Pregnancy induced hypertension    labetolol til    No Known Allergies   Review of Systems:  No headache, visual changes, nausea, vomiting, diarrhea, constipation, dizziness, abdominal pain, skin rash, fevers, chills, night sweats, weight loss, swollen lymph nodes, body aches, joint swelling, chest pain, shortness of breath, mood changes. POSITIVE muscle aches  Objective  unknown if currently breastfeeding.   General: No apparent distress alert and oriented x3 mood and affect normal, dressed appropriately.  HEENT: Pupils equal, extraocular movements intact  Respiratory: Patient's speak in full sentences and does not appear short of breath  Cardiovascular: No lower extremity edema, non tender, no erythema  Neuro: Cranial nerves II through XII are intact, neurovascularly intact in all extremities with 2+ DTRs and 2+ pulses.  Gait normal  with good balance and coordination.  MSK:  Non tender with full range of motion and good stability and symmetric strength and tone of shoulders, elbows, wrist, hip, knee and ankles bilaterally.  Back - Normal skin, Spine with normal alignment and no deformity.  No tenderness to vertebral process palpation.  Paraspinous muscles are not tender and without spasm.   Range of motion is full at neck and lumbar sacral regions  Osteopathic findings  C2 flexed rotated and side bent right C6 flexed rotated and side bent left T3 extended rotated and side bent right inhaled rib T9 extended rotated and side bent left L2 flexed rotated and side bent right Sacrum right on right       Assessment and Plan:    Nonallopathic problems  Decision today to treat with OMT was based on Physical Exam  After verbal consent patient was treated with HVLA, ME, FPR techniques in cervical, rib, thoracic, lumbar, and sacral  areas  Patient tolerated the procedure well with improvement in symptoms  Patient given exercises, stretches and lifestyle modifications  See medications in patient instructions if given  Patient will follow up in 4-8 weeks      The above documentation has been reviewed and is accurate and complete Lyndal Pulley, DO       Note: This dictation was prepared with Dragon dictation along with smaller phrase technology. Any transcriptional errors that result from this process are unintentional.

## 2021-03-22 ENCOUNTER — Ambulatory Visit: Payer: 59 | Admitting: Family Medicine

## 2021-04-15 ENCOUNTER — Other Ambulatory Visit: Payer: Self-pay | Admitting: Family Medicine

## 2021-04-15 ENCOUNTER — Other Ambulatory Visit (HOSPITAL_COMMUNITY): Payer: Self-pay

## 2021-04-15 MED ORDER — VENLAFAXINE HCL ER 75 MG PO CP24
ORAL_CAPSULE | Freq: Every day | ORAL | 3 refills | Status: DC
  Filled 2021-04-15: qty 30, 30d supply, fill #0
  Filled 2021-05-14: qty 30, 30d supply, fill #1

## 2021-04-16 ENCOUNTER — Other Ambulatory Visit (HOSPITAL_COMMUNITY): Payer: Self-pay

## 2021-05-14 ENCOUNTER — Other Ambulatory Visit (HOSPITAL_COMMUNITY): Payer: Self-pay

## 2021-06-24 ENCOUNTER — Other Ambulatory Visit (HOSPITAL_COMMUNITY): Payer: Self-pay

## 2021-08-31 IMAGING — DX DG CHEST 2V
2 series · 2 of 2 positions shown · non-contrast
Comparison: None.

CLINICAL DATA: Dyspnea and wheezing. Recent [id number] virus
infection.

EXAM:
CHEST - 2 VIEW

[chest pa]
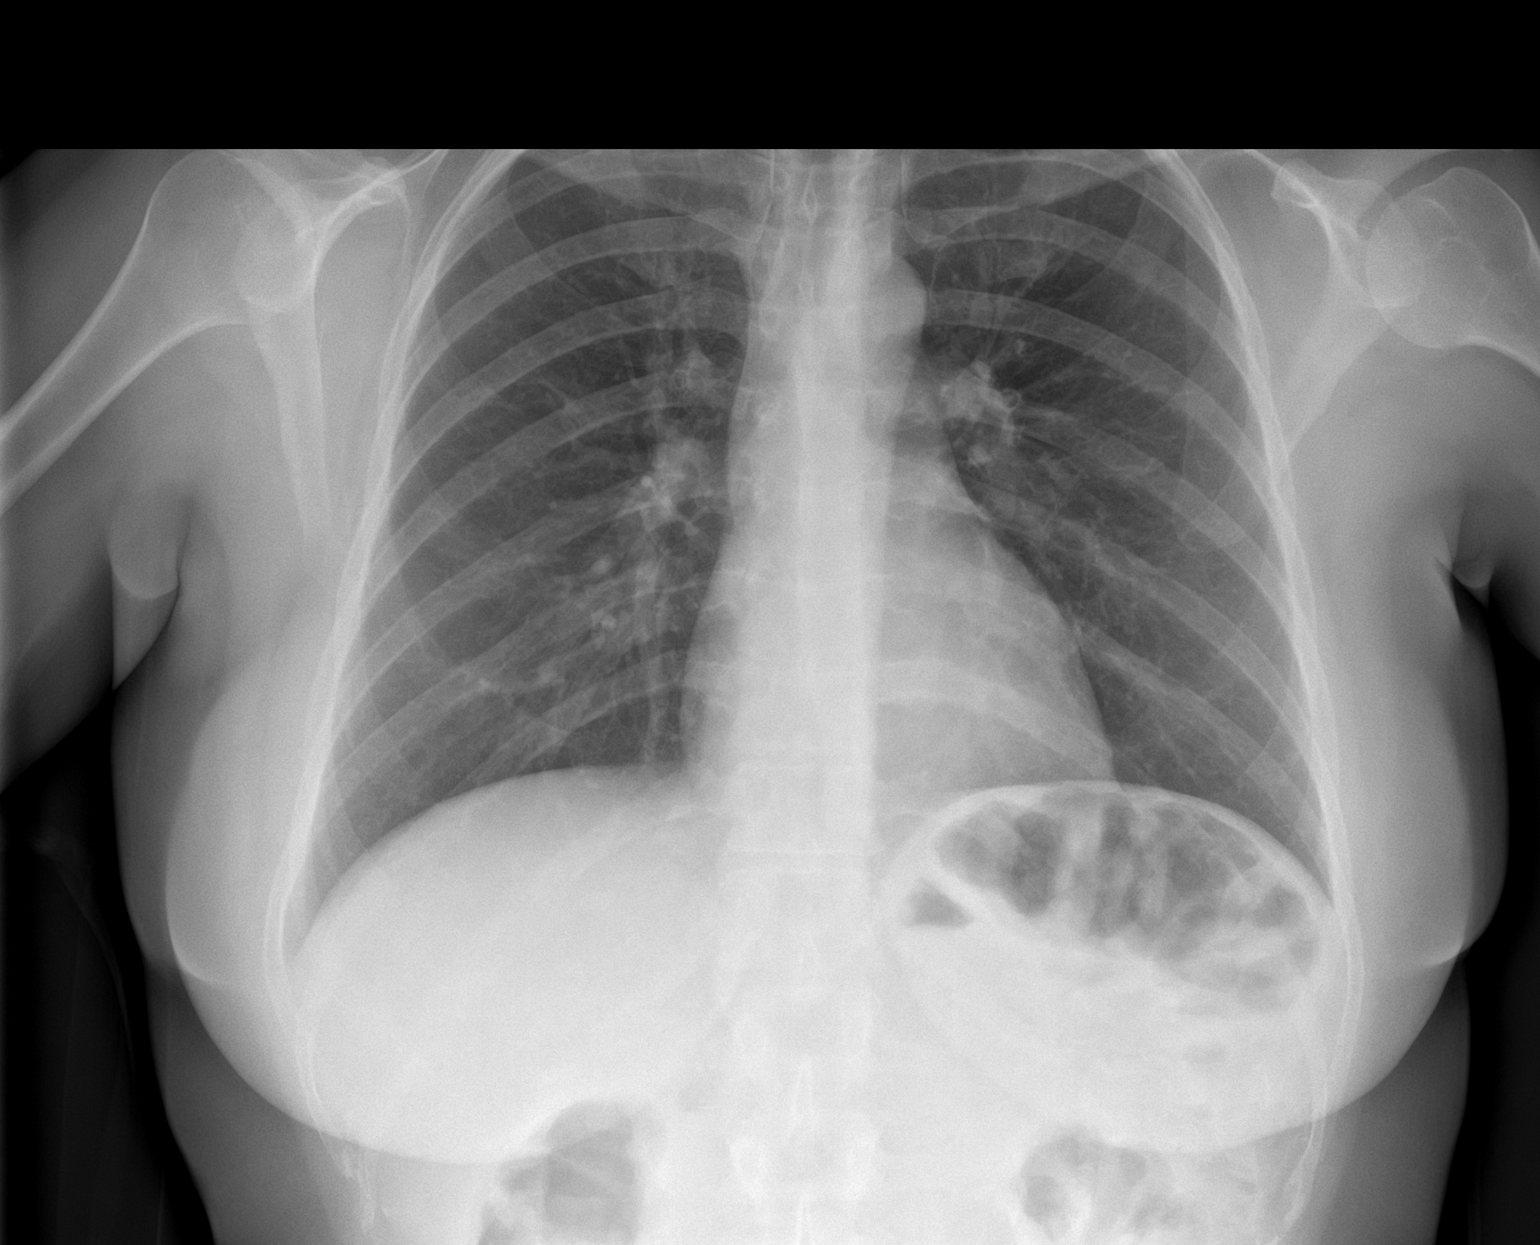

[chest lat]
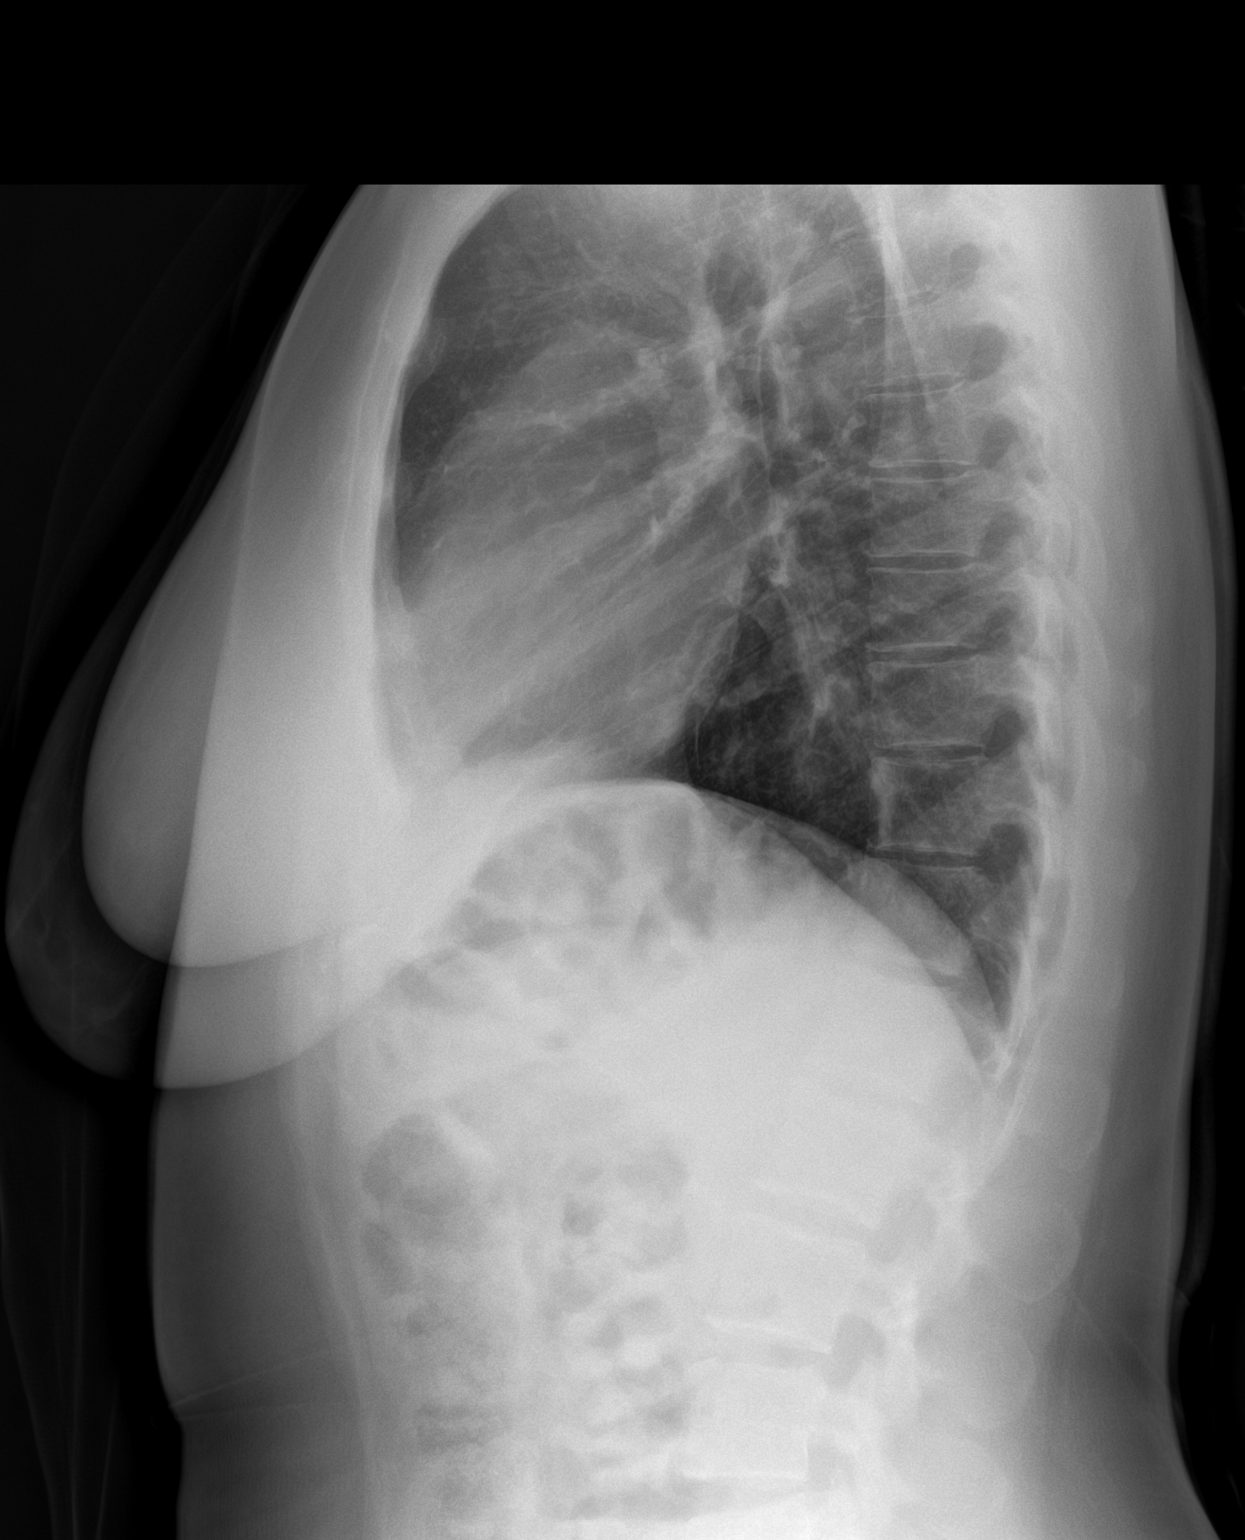

[2 of 2 positions shown; findings below may reference images not displayed]

FINDINGS: The heart size and mediastinal contours are within normal limits.
Both lungs are clear. The visualized skeletal structures are
unremarkable.
IMPRESSION: Negative.  No active cardiopulmonary disease.

## 2021-10-12 ENCOUNTER — Ambulatory Visit: Payer: 59 | Admitting: Family Medicine
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
## 2021-11-19 ENCOUNTER — Other Ambulatory Visit: Payer: Self-pay

## 2021-11-19 ENCOUNTER — Ambulatory Visit (INDEPENDENT_AMBULATORY_CARE_PROVIDER_SITE_OTHER): Payer: No Typology Code available for payment source | Admitting: Family Medicine

## 2021-11-19 ENCOUNTER — Encounter: Payer: Self-pay | Admitting: Family Medicine

## 2021-11-19 VITALS — BP 112/88 | HR 75 | Ht 60.0 in | Wt 150.0 lb

## 2021-11-19 DIAGNOSIS — M9908 Segmental and somatic dysfunction of rib cage: Secondary | ICD-10-CM

## 2021-11-19 DIAGNOSIS — M9902 Segmental and somatic dysfunction of thoracic region: Secondary | ICD-10-CM

## 2021-11-19 DIAGNOSIS — L659 Nonscarring hair loss, unspecified: Secondary | ICD-10-CM | POA: Insufficient documentation

## 2021-11-19 DIAGNOSIS — M9903 Segmental and somatic dysfunction of lumbar region: Secondary | ICD-10-CM

## 2021-11-19 DIAGNOSIS — M9904 Segmental and somatic dysfunction of sacral region: Secondary | ICD-10-CM | POA: Diagnosis not present

## 2021-11-19 DIAGNOSIS — M9901 Segmental and somatic dysfunction of cervical region: Secondary | ICD-10-CM | POA: Diagnosis not present

## 2021-11-19 DIAGNOSIS — M2550 Pain in unspecified joint: Secondary | ICD-10-CM

## 2021-11-19 DIAGNOSIS — M545 Low back pain, unspecified: Secondary | ICD-10-CM | POA: Diagnosis not present

## 2021-11-19 LAB — CBC WITH DIFFERENTIAL/PLATELET
Basophils Absolute: 0.1 10*3/uL (ref 0.0–0.1)
Basophils Relative: 0.8 % (ref 0.0–3.0)
Eosinophils Absolute: 0.2 10*3/uL (ref 0.0–0.7)
Eosinophils Relative: 3.4 % (ref 0.0–5.0)
HCT: 39 % (ref 36.0–46.0)
Hemoglobin: 13.4 g/dL (ref 12.0–15.0)
Lymphocytes Relative: 38.9 % (ref 12.0–46.0)
Lymphs Abs: 2.6 10*3/uL (ref 0.7–4.0)
MCHC: 34.3 g/dL (ref 30.0–36.0)
MCV: 93 fl (ref 78.0–100.0)
Monocytes Absolute: 0.5 10*3/uL (ref 0.1–1.0)
Monocytes Relative: 7.6 % (ref 3.0–12.0)
Neutro Abs: 3.3 10*3/uL (ref 1.4–7.7)
Neutrophils Relative %: 49.3 % (ref 43.0–77.0)
Platelets: 300 10*3/uL (ref 150.0–400.0)
RBC: 4.19 Mil/uL (ref 3.87–5.11)
RDW: 12.4 % (ref 11.5–15.5)
WBC: 6.7 10*3/uL (ref 4.0–10.5)

## 2021-11-19 LAB — COMPREHENSIVE METABOLIC PANEL
ALT: 23 U/L (ref 0–35)
AST: 25 U/L (ref 0–37)
Albumin: 4.3 g/dL (ref 3.5–5.2)
Alkaline Phosphatase: 57 U/L (ref 39–117)
BUN: 14 mg/dL (ref 6–23)
CO2: 29 mEq/L (ref 19–32)
Calcium: 9.4 mg/dL (ref 8.4–10.5)
Chloride: 100 mEq/L (ref 96–112)
Creatinine, Ser: 0.61 mg/dL (ref 0.40–1.20)
GFR: 113.16 mL/min (ref >60.00)
Glucose, Bld: 86 mg/dL (ref 70–99)
Potassium: 3.6 mEq/L (ref 3.5–5.1)
Sodium: 136 mEq/L (ref 135–145)
Total Bilirubin: 0.8 mg/dL (ref 0.2–1.2)
Total Protein: 7.4 g/dL (ref 6.0–8.3)

## 2021-11-19 LAB — LIPID PANEL
Cholesterol: 188 mg/dL (ref 0–200)
HDL: 57.5 mg/dL (ref >39.00)
LDL Cholesterol: 119 mg/dL — ABNORMAL HIGH (ref 0–99)
NonHDL: 130.98
Total CHOL/HDL Ratio: 3
Triglycerides: 61 mg/dL (ref 0.0–149.0)
VLDL: 12.2 mg/dL (ref 0.0–40.0)

## 2021-11-19 LAB — T3, FREE: T3, Free: 3 pg/mL (ref 2.3–4.2)

## 2021-11-19 LAB — VITAMIN D 25 HYDROXY (VIT D DEFICIENCY, FRACTURES): VITD: 29.03 ng/mL — ABNORMAL LOW (ref 30.00–100.00)

## 2021-11-19 LAB — TSH: TSH: 1.03 u[IU]/mL (ref 0.35–5.50)

## 2021-11-19 LAB — FERRITIN: Ferritin: 24.4 ng/mL (ref 10.0–291.0)

## 2021-11-19 LAB — SEDIMENTATION RATE: Sed Rate: 18 mm/hr (ref 0–20)

## 2021-11-19 LAB — T4, FREE: Free T4: 0.83 ng/dL (ref 0.60–1.60)

## 2021-11-19 NOTE — Assessment & Plan Note (Signed)
We will get laboratory work-up.  Encourage patient to find a primary care provider.

## 2021-11-19 NOTE — Assessment & Plan Note (Signed)
Low back pain noted.  Discussed which activities to do which wants to avoid.Discussed increasing activity and home exercises.Follow-up with me again in 4 to 8 weeks responds well to osteopathic manipulation

## 2021-11-19 NOTE — Patient Instructions (Signed)
Dr. Raoul Pitch for PCP  See me when you need me  Labs today

## 2021-11-22 LAB — ANA: Anti Nuclear Antibody (ANA): NEGATIVE

## 2022-04-05 ENCOUNTER — Encounter: Payer: Self-pay | Admitting: Nurse Practitioner

## 2022-04-05 ENCOUNTER — Ambulatory Visit (INDEPENDENT_AMBULATORY_CARE_PROVIDER_SITE_OTHER): Payer: No Typology Code available for payment source | Admitting: Nurse Practitioner

## 2022-04-05 VITALS — BP 138/82 | HR 86 | Temp 97.9°F | Resp 16 | Ht 60.0 in | Wt 153.8 lb

## 2022-04-05 DIAGNOSIS — Z7689 Persons encountering health services in other specified circumstances: Secondary | ICD-10-CM | POA: Diagnosis not present

## 2022-04-05 DIAGNOSIS — M545 Low back pain, unspecified: Secondary | ICD-10-CM

## 2022-04-05 DIAGNOSIS — F411 Generalized anxiety disorder: Secondary | ICD-10-CM | POA: Diagnosis not present

## 2022-04-05 DIAGNOSIS — U099 Post covid-19 condition, unspecified: Secondary | ICD-10-CM

## 2022-04-05 DIAGNOSIS — E669 Obesity, unspecified: Secondary | ICD-10-CM

## 2022-04-05 DIAGNOSIS — R0609 Other forms of dyspnea: Secondary | ICD-10-CM

## 2022-04-05 DIAGNOSIS — L659 Nonscarring hair loss, unspecified: Secondary | ICD-10-CM

## 2022-04-05 DIAGNOSIS — I10 Essential (primary) hypertension: Secondary | ICD-10-CM

## 2022-04-05 DIAGNOSIS — G8929 Other chronic pain: Secondary | ICD-10-CM

## 2022-04-05 DIAGNOSIS — E7800 Pure hypercholesterolemia, unspecified: Secondary | ICD-10-CM
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
## 2022-04-06 ENCOUNTER — Encounter: Payer: Self-pay | Admitting: Nurse Practitioner

## 2022-04-26 ENCOUNTER — Other Ambulatory Visit (HOSPITAL_COMMUNITY): Payer: Self-pay

## 2022-05-05 ENCOUNTER — Encounter: Payer: Self-pay | Admitting: Nurse Practitioner

## 2022-05-05 ENCOUNTER — Ambulatory Visit (INDEPENDENT_AMBULATORY_CARE_PROVIDER_SITE_OTHER): Payer: No Typology Code available for payment source | Admitting: Nurse Practitioner

## 2022-05-05 VITALS — BP 112/88 | HR 70 | Temp 97.2°F | Wt 145.2 lb

## 2022-05-05 DIAGNOSIS — Z79899 Other long term (current) drug therapy: Secondary | ICD-10-CM

## 2022-05-05 DIAGNOSIS — E7800 Pure hypercholesterolemia, unspecified: Secondary | ICD-10-CM | POA: Diagnosis not present

## 2022-05-05 DIAGNOSIS — E669 Obesity, unspecified: Secondary | ICD-10-CM | POA: Diagnosis not present

## 2022-05-05 DIAGNOSIS — I10 Essential (primary) hypertension: Secondary | ICD-10-CM | POA: Diagnosis not present

## 2022-05-05 MED ORDER — WEGOVY 0.5 MG/0.5ML ~~LOC~~ SOAJ
0.5000 mg | SUBCUTANEOUS | 0 refills | Status: DC
  Filled 2022-05-26: qty 2, 28d supply, fill #0

## 2022-05-05 NOTE — Patient Instructions (Signed)
Semaglutide Injection (Weight Management) ?What is this medication? ?SEMAGLUTIDE (SEM a GLOO tide) promotes weight loss. It may also be used to maintain weight loss. It works by decreasing appetite. Changes to diet and exercise are often combined with this medication. ?This medicine may be used for other purposes; ask your health care provider or pharmacist if you have questions. ?COMMON BRAND NAME(S): Wegovy ?What should I tell my care team before I take this medication? ?They need to know if you have any of these conditions: ?Endocrine tumors (MEN 2) or if someone in your family had these tumors ?Eye disease, vision problems ?Gallbladder disease ?History of depression or mental health disease ?History of pancreatitis ?Kidney disease ?Stomach or intestine problems ?Suicidal thoughts, plans, or attempt; a previous suicide attempt by you or a family member ?Thyroid cancer or if someone in your family had thyroid cancer ?An unusual or allergic reaction to semaglutide, other medications, foods, dyes, or preservatives ?Pregnant or trying to get pregnant ?Breast-feeding ?How should I use this medication? ?This medication is injected under the skin. You will be taught how to prepare and give it. Take it as directed on the prescription label. It is given once every week (every 7 days). Keep taking it unless your care team tells you to stop. ?It is important that you put your used needles and pens in a special sharps container. Do not put them in a trash can. If you do not have a sharps container, call your pharmacist or care team to get one. ?A special MedGuide will be given to you by the pharmacist with each prescription and refill. Be sure to read this information carefully each time. ?This medication comes with INSTRUCTIONS FOR USE. Ask your pharmacist for directions on how to use this medication. Read the information carefully. Talk to your pharmacist or care team if you have questions. ?Talk to your care team about  the use of this medication in children. While it may be prescribed for children as young as 12 years for selected conditions, precautions do apply. ?Overdosage: If you think you have taken too much of this medicine contact a poison control center or emergency room at once. ?NOTE: This medicine is only for you. Do not share this medicine with others. ?What if I miss a dose? ?If you miss a dose and the next scheduled dose is more than 2 days away, take the missed dose as soon as possible. If you miss a dose and the next scheduled dose is less than 2 days away, do not take the missed dose. Take the next dose at your regular time. Do not take double or extra doses. If you miss your dose for 2 weeks or more, take the next dose at your regular time or call your care team to talk about how to restart this medication. ?What may interact with this medication? ?Insulin and other medications for diabetes ?This list may not describe all possible interactions. Give your health care provider a list of all the medicines, herbs, non-prescription drugs, or dietary supplements you use. Also tell them if you smoke, drink alcohol, or use illegal drugs. Some items may interact with your medicine. ?What should I watch for while using this medication? ?Visit your care team for regular checks on your progress. It may be some time before you see the benefit from this medication. ?Drink plenty of fluids while taking this medication. Check with your care team if you have severe diarrhea, nausea, and vomiting, or if you sweat a   lot. The loss of too much body fluid may make it dangerous for you to take this medication. ?This medication may affect blood sugar levels. Ask your care team if changes in diet or medications are needed if you have diabetes. ?If you or your family notice any changes in your behavior, such as new or worsening depression, thoughts of harming yourself, anxiety, other unusual or disturbing thoughts, or memory loss, call  your care team right away. ?Women should inform their care team if they wish to become pregnant or think they might be pregnant. Losing weight while pregnant is not advised and may cause harm to the unborn child. Talk to your care team for more information. ?What side effects may I notice from receiving this medication? ?Side effects that you should report to your care team as soon as possible: ?Allergic reactions--skin rash, itching, hives, swelling of the face, lips, tongue, or throat ?Change in vision ?Dehydration--increased thirst, dry mouth, feeling faint or lightheaded, headache, dark yellow or brown urine ?Gallbladder problems--severe stomach pain, nausea, vomiting, fever ?Heart palpitations--rapid, pounding, or irregular heartbeat ?Kidney injury--decrease in the amount of urine, swelling of the ankles, hands, or feet ?Pancreatitis--severe stomach pain that spreads to your back or gets worse after eating or when touched, fever, nausea, vomiting ?Thoughts of suicide or self-harm, worsening mood, feelings of depression ?Thyroid cancer--new mass or lump in the neck, pain or trouble swallowing, trouble breathing, hoarseness ?Side effects that usually do not require medical attention (report to your care team if they continue or are bothersome): ?Diarrhea ?Loss of appetite ?Nausea ?Stomach pain ?Vomiting ?This list may not describe all possible side effects. Call your doctor for medical advice about side effects. You may report side effects to FDA at 1-800-FDA-1088. ?Where should I keep my medication? ?Keep out of the reach of children and pets. ?Refrigeration (preferred): Store in the refrigerator. Do not freeze. Keep this medication in the original container until you are ready to take it. Get rid of any unused medication after the expiration date. ?Room temperature: If needed, prior to cap removal, the pen can be stored at room temperature for up to 28 days. Protect from light. If it is stored at room  temperature, get rid of any unused medication after 28 days or after it expires, whichever is first. ?It is important to get rid of the medication as soon as you no longer need it or it is expired. You can do this in two ways: ?Take the medication to a medication take-back program. Check with your pharmacy or law enforcement to find a location. ?If you cannot return the medication, follow the directions in the MedGuide. ?NOTE: This sheet is a summary. It may not cover all possible information. If you have questions about this medicine, talk to your doctor, pharmacist, or health care provider. ?? 2023 Elsevier/Gold Standard (2021-12-22 00:00:00) ? ?

## 2022-05-05 NOTE — Progress Notes (Signed)
Assessment and Plan:  MAYURI STAPLES was seen today for a follow up.  Diagnoses and all order for this visit:  1. Obesity (BMI 30.0-34.9) Patient has lost 8 lb since [place] well.  Increase dose to Wegovy 0.5 mg. Side effects discussed. Continue to monitor.  - Semaglutide-Weight Management (WEGOVY) 0.5 MG/0.5ML SOAJ; Inject 0.5 mg into the skin once a week.  Dispense: 2 mL; Refill: 0  2. Hypertension, unspecified type Well controlled in clinic. Review of in home BP readings to continue. Continue lifestyle modifications Will continue to discuss need for BP medication as weight loss continues.  3. Elevated LDL cholesterol level Continue lifestyle modifications. Continue weight loss.  4. Medication management Discussed and reviewed medications. All questions and concerns addressed.    Further disposition pending results of labs. Discussed med's effects and SE's.    Over 15 minutes of exam, counseling, chart review, and critical decision making was performed.   No future appointments.  ------------------------------------------------------------------------------------------------------------------   HPI BP 112/88   Pulse 70   Temp (!) 97.2 F (36.2 C)   Wt 145 lb 3.2 oz (65.9 kg)   LMP 04/28/2022   SpO2 95%   BMI 28.36 kg/m   41 y.o.female presents for follow up on obesity and HTN.  During last OV she was provided Wegovy 0.25 mg sample.  She reports minimal SE that are now well controlled.  She has lost 8 lb in 1 mo.  She is continuing to implement lifestyle modifications.  She was instructed to record in home BP readings and RTC for review of possible start of BP medication.  Her average in home reading is ~130/90.  However, BP today in clinic is at goal.    Past Medical History:  Diagnosis Date   Abnormal Pap smear    Anemia    Bell's palsy    Female infertility associated with anovulation    Hx of varicella    Hypertension    Induction  of Labor 10/28/2013   Migraine    Pregnancy induced hypertension    labetolol til     No Known Allergies  Current Outpatient Medications on File Prior to Visit  Medication Sig   acetaminophen (TYLENOL) 500 MG tablet Take 500 mg by mouth every 6 (six) hours as needed.   ibuprofen (ADVIL,MOTRIN) 600 MG tablet Take 1 tablet (600 mg total) by mouth every 6 (six) hours.   venlafaxine XR (EFFEXOR-XR) 75 MG 24 hr capsule TAKE 1 CAPSULE BY MOUTH ONCE DAILY WITH BREAKFAST   No current facility-administered medications on file prior to visit.    ROS: all negative except what is noted in the HPI.   Physical Exam:  BP 112/88   Pulse 70   Temp (!) 97.2 F (36.2 C)   Wt 145 lb 3.2 oz (65.9 kg)   LMP 04/28/2022   SpO2 95%   BMI 28.36 kg/m   BMI is Body mass index is 28.36 kg/m., she has been working on diet and exercise.  She has lost 8 lb since LOV. Wt Readings from Last 3 Encounters:  05/05/22 145 lb 3.2 oz (65.9 kg)  04/05/22 153 lb 12.8 oz (69.8 kg)  11/19/21 150 lb (68 kg)   General Appearance: NAD.  Awake, conversant and cooperative. Eyes: PERRLA, EOMs intact.  Sclera white.  Conjunctiva without erythema. Sinuses: No frontal/maxillary tenderness.  No nasal discharge. Nares patent.  ENT/Mouth: Ext aud canals clear.  Bilateral TMs w/DOL and without erythema or bulging. Hearing intact.  Posterior pharynx without swelling or exudate.  Tonsils without swelling or erythema.  Neck: Supple.  No masses, nodules or thyromegaly. Respiratory: Effort is regular with non-labored breathing. Breath sounds are equal bilaterally without rales, rhonchi, wheezing or stridor.  Cardio: RRR with no MRGs. Brisk peripheral pulses without edema.  Abdomen: Active BS in all four quadrants.  Soft and non-tender without guarding, rebound tenderness, hernias or masses. Lymphatics: Non tender without lymphadenopathy.  Musculoskeletal: Full ROM, 5/5 strength, normal ambulation.  No clubbing or cyanosis. Skin:  Appropriate color for ethnicity. Warm without rashes, lesions, ecchymosis, ulcers.  Neuro: CN II-XII grossly normal. Normal muscle tone without cerebellar symptoms and intact sensation.   Psych: AO X 3,  appropriate mood and affect, insight and judgment.     Darrol Jump, NP 4:12 PM [facility]

## 2022-05-10 ENCOUNTER — Encounter: Payer: Self-pay | Admitting: Nurse Practitioner

## 2022-05-24 ENCOUNTER — Other Ambulatory Visit (HOSPITAL_COMMUNITY): Payer: Self-pay

## 2022-05-26 ENCOUNTER — Other Ambulatory Visit (HOSPITAL_COMMUNITY): Payer: Self-pay

## 2022-05-26 ENCOUNTER — Encounter: Payer: Self-pay | Admitting: Nurse Practitioner

## 2022-06-07 ENCOUNTER — Ambulatory Visit (INDEPENDENT_AMBULATORY_CARE_PROVIDER_SITE_OTHER): Payer: 59 | Admitting: Nurse Practitioner

## 2022-06-07 ENCOUNTER — Encounter: Payer: Self-pay | Admitting: Nurse Practitioner

## 2022-06-07 VITALS — BP 116/90 | HR 82 | Temp 97.9°F | Wt 142.8 lb

## 2022-06-07 DIAGNOSIS — F411 Generalized anxiety disorder: Secondary | ICD-10-CM | POA: Diagnosis not present

## 2022-06-07 DIAGNOSIS — E663 Overweight: Secondary | ICD-10-CM

## 2022-06-07 DIAGNOSIS — E559 Vitamin D deficiency, unspecified: Secondary | ICD-10-CM

## 2022-06-07 DIAGNOSIS — R231 Pallor: Secondary | ICD-10-CM

## 2022-06-07 DIAGNOSIS — R030 Elevated blood-pressure reading, without diagnosis of hypertension: Secondary | ICD-10-CM | POA: Diagnosis not present

## 2022-06-07 NOTE — Progress Notes (Incomplete)
Assessment and Plan:  Wendy [name] was seen today for a ***.  Diagnoses and all order for this visit:  1. Elevated blood pressure reading ***  2. Overweight (BMI 25.0-29.9) ***  3. Generalized anxiety disorder ***  4. Vitamin D deficiency ***   There are no diagnoses linked to this encounter.  Spot on leg on left started 3 mo ago.     Continue to monitor for any increase in fever, chills, N/V, diarrhea, changes to bowel habits, blood in stool.  Notify office for further evaluation and treatment, questions or concerns if s/s fail to improve. The risks and benefits of my recommendations, as well as other treatment options were discussed with the patient today. Questions were answered.  Further disposition pending results of labs. Discussed med's effects and SE's.    Over *** minutes of exam, counseling, chart review, and critical decision making was performed.   No future appointments.  ------------------------------------------------------------------------------------------------------------------ BMI is Body mass index is 27.89 kg/m., she {HAS HAS TWS:56812} been working on diet and exercise. Wt Readings from Last 3 Encounters:  06/07/22 142 lb 12.8 oz (64.8 kg)  05/05/22 145 lb 3.2 oz (65.9 kg)  04/05/22 153 lb 12.8 oz (69.8 kg)     HPI BP 116/90   Pulse 82   Temp 97.9 F (36.6 C)   Wt 142 lb 12.8 oz (64.8 kg)   SpO2 99%   BMI 27.89 kg/m   41 y.o.female presents for    Past Medical History:  Diagnosis Date  . Abnormal Pap smear   . Anemia   . Bell's palsy   . Female infertility associated with anovulation   . Hx of varicella   . Hypertension   . Induction of Labor 10/28/2013  . Migraine   . Pregnancy induced hypertension    labetolol til     No Known Allergies  Current Outpatient Medications on File Prior to Visit  Medication Sig  . acetaminophen (TYLENOL) 500 MG tablet Take 500 mg by mouth every 6 (six) hours as needed.  Marland Kitchen ibuprofen  (ADVIL,MOTRIN) 600 MG tablet Take 1 tablet (600 mg total) by mouth every 6 (six) hours.  . Semaglutide-Weight Management (WEGOVY) 0.5 MG/0.5ML SOAJ Inject 0.5 mg into the skin once a week. (Patient not taking: Reported on 06/07/2022)  . venlafaxine XR (EFFEXOR-XR) 75 MG 24 hr capsule TAKE 1 CAPSULE BY MOUTH ONCE DAILY WITH BREAKFAST   No current facility-administered medications on file prior to visit.    ROS: all negative except what is noted in the HPI.   ROS   Physical Exam:  BP 116/90   Pulse 82   Temp 97.9 F (36.6 C)   Wt 142 lb 12.8 oz (64.8 kg)   SpO2 99%   BMI 27.89 kg/m   General Appearance: NAD.  Awake, conversant and cooperative. Eyes: PERRLA, EOMs intact.  Sclera white.  Conjunctiva without erythema. Sinuses: No frontal/maxillary tenderness.  No nasal discharge. Nares patent.  ENT/Mouth: Ext aud canals clear.  Bilateral TMs w/DOL and without erythema or bulging. Hearing intact.  Posterior pharynx without swelling or exudate.  Tonsils without swelling or erythema.  Neck: Supple.  No masses, nodules or thyromegaly. Respiratory: Effort is regular with non-labored breathing. Breath sounds are equal bilaterally without rales, rhonchi, wheezing or stridor.  Cardio: RRR with no MRGs. Brisk peripheral pulses without edema.  Abdomen: Active BS in all four quadrants.  Soft and non-tender without guarding, rebound tenderness, hernias or masses. Lymphatics: Non tender without lymphadenopathy.  Musculoskeletal: Full ROM, 5/5 strength, normal ambulation.  No clubbing or cyanosis. Skin: Appropriate color for ethnicity. Warm without rashes, lesions, ecchymosis, ulcers.  Neuro: CN II-XII grossly normal. Normal muscle tone without cerebellar symptoms and intact sensation.   Psych: AO X 3,  appropriate mood and affect, insight and judgment.     Darrol Jump, NP 4:34 PM [facility]

## 2022-06-07 NOTE — Patient Instructions (Signed)

## 2022-06-08 LAB — COMPLETE METABOLIC PANEL WITH GFR
AG Ratio: 1.7 (calc) (ref 1.0–2.5)
ALT: 12 U/L (ref 6–29)
AST: 16 U/L (ref 10–30)
Albumin: 4.3 g/dL (ref 3.6–5.1)
Alkaline phosphatase (APISO): 54 U/L (ref 31–125)
BUN: 12 mg/dL (ref 7–25)
CO2: 28 mmol/L (ref 20–32)
Calcium: 9.3 mg/dL (ref 8.6–10.2)
Chloride: 104 mmol/L (ref 98–110)
Creat: 0.57 mg/dL (ref 0.50–0.97)
Globulin: 2.6 g/dL (calc) (ref 1.9–3.7)
Glucose, Bld: 87 mg/dL (ref 65–99)
Potassium: 3.9 mmol/L (ref 3.5–5.3)
Sodium: 139 mmol/L (ref 135–146)
Total Bilirubin: 0.8 mg/dL (ref 0.2–1.2)
Total Protein: 6.9 g/dL (ref 6.1–8.1)
eGFR: 118 mL/min/{1.73_m2} (ref >=60)

## 2022-06-08 LAB — CBC WITH DIFFERENTIAL/PLATELET
Absolute Monocytes: 499 cells/uL (ref 200–950)
Basophils Absolute: 32 cells/uL (ref 0–200)
Basophils Relative: 0.5 %
Eosinophils Absolute: 128 cells/uL (ref 15–500)
Eosinophils Relative: 2 %
HCT: 40.5 % (ref 35.0–45.0)
Hemoglobin: 13.4 g/dL (ref 11.7–15.5)
Lymphs Abs: 2189 cells/uL (ref 850–3900)
MCH: 31.7 pg (ref 27.0–33.0)
MCHC: 33.1 g/dL (ref 32.0–36.0)
MCV: 95.7 fL (ref 80.0–100.0)
MPV: 9.5 fL (ref 7.5–12.5)
Monocytes Relative: 7.8 %
Neutro Abs: 3552 cells/uL (ref 1500–7800)
Neutrophils Relative %: 55.5 %
Platelets: 284 10*3/uL (ref 140–400)
RBC: 4.23 10*6/uL (ref 3.80–5.10)
RDW: 12.1 % (ref 11.0–15.0)
Total Lymphocyte: 34.2 %
WBC: 6.4 10*3/uL (ref 3.8–10.8)

## 2022-06-08 LAB — LIPID PANEL
Cholesterol: 179 mg/dL (ref <200)
HDL: 65 mg/dL (ref >=50)
LDL Cholesterol (Calc): 99 mg/dL (calc)
Non-HDL Cholesterol (Calc): 114 mg/dL (calc) (ref <130)
Total CHOL/HDL Ratio: 2.8 (calc) (ref <5.0)
Triglycerides: 63 mg/dL (ref <150)

## 2022-06-08 LAB — TSH: TSH: 0.79 mIU/L

## 2022-06-08 LAB — VITAMIN D 25 HYDROXY (VIT D DEFICIENCY, FRACTURES): Vit D, 25-Hydroxy: 34 ng/mL (ref 30–100)

## 2022-06-09 NOTE — Progress Notes (Signed)
Assessment and Plan:  Wendy [name] was seen today for a follow up.  Diagnoses and all order for this visit:  1. Elevated blood pressure reading Controlled  Continue lifestyle modifications. Discussed DASH (Dietary Approaches to Stop Hypertension) DASH diet is lower in sodium than a typical American diet. Cut back on foods that are high in saturated fat, cholesterol, and trans fats. Eat more whole-grain foods, fish, poultry, and nuts Remain active and exercise as tolerated daily.  Monitor BP at home-Call if greater than 130/80.    2. Overweight (BMI 25.0-29.9) Achieving recommended BMI Discussed appropriate BMI Goal of losing 1 lb per month. Continue Wegovy for further weigh loss with comorbidity. Diet modification. Physical activity. Encouraged/praised to build confidence.   3. Generalized anxiety disorder Controlled. Continue Venlafaxine. Continue to monitor   4. Vitamin D deficiency Check Vitamin D levels. Continue to monitor   5.   Livedo reticularis  Most likely related to being chilled or cold. Possible reaction to any new medication or supplement.  Continue to monitor  Orders Placed This Encounter  Procedures   CBC with Differential/Platelet   COMPLETE METABOLIC PANEL WITH GFR   Lipid panel   TSH   VITAMIN D 25 Hydroxy (Vit-D Deficiency, Fractures)     Continue to monitor for any increase in fever, chills, N/V, diarrhea, changes to bowel habits, blood in stool.  Notify office for further evaluation and treatment, questions or concerns if s/s fail to improve. The risks and benefits of my recommendations, as well as other treatment options were discussed with the patient today. Questions were answered.  Further disposition pending results of labs. Discussed med's effects and SE's.    Over 20 minutes of exam, counseling, chart review, and critical decision making was performed.   Future Appointments  Date Time Provider [place]  07/05/2022   3:30 PM Darrol Jump, NP [facility] None    ------------------------------------------------------------------------------------------------------------------ BMI is Body mass index is 27.89 kg/m., she has been working on diet and exercise.  She has lost 3 lb since last OV.  Wt Readings from Last 3 Encounters:  06/07/22 142 lb 12.8 oz (64.8 kg)  05/05/22 145 lb 3.2 oz (65.9 kg)  04/05/22 153 lb 12.8 oz (69.8 kg)     HPI BP 116/90   Pulse 82   Temp 97.9 F (36.6 C)   Wt 142 lb 12.8 oz (64.8 kg)   SpO2 99%   BMI 27.89 kg/m   41 y.o.female presents for a general follow up.    During last OV she was most concerned for obesity and HTN.  She reported that when taking her BP in the home, it remained elevated ~130/90.  She has continued to make lifestyle modifications and her BP has now averaged to <120/80.    She has also been working on diet and exercise.  She has lost 3 lb over the last month.  She is interested in continuing to lose weigh for overall health and reduction in any further comorbidity.   She is concerned today for a splotchy honeycomb pattern that has appeared on the back of her right leg over the last month.  Reports using a small portable heater under her desk at work d/t being cold. Denies pain, itching, burning, warmth, spreading, injury, fall, trauma.   Past Medical History:  Diagnosis Date   Abnormal Pap smear    Anemia    Bell's palsy    Female infertility associated with anovulation    Hx of varicella  Hypertension    Induction of Labor 10/28/2013   Migraine    Pregnancy induced hypertension    labetolol til     No Known Allergies  Current Outpatient Medications on File Prior to Visit  Medication Sig   acetaminophen (TYLENOL) 500 MG tablet Take 500 mg by mouth every 6 (six) hours as needed.   ibuprofen (ADVIL,MOTRIN) 600 MG tablet Take 1 tablet (600 mg total) by mouth every 6 (six) hours.   Semaglutide-Weight Management (WEGOVY) 0.5 MG/0.5ML  SOAJ Inject 0.5 mg into the skin once a week. (Patient not taking: Reported on 06/07/2022)   venlafaxine XR (EFFEXOR-XR) 75 MG 24 hr capsule TAKE 1 CAPSULE BY MOUTH ONCE DAILY WITH BREAKFAST   No current facility-administered medications on file prior to visit.    ROS: all negative except what is noted in the HPI.    Physical Exam:  BP 116/90   Pulse 82   Temp 97.9 F (36.6 C)   Wt 142 lb 12.8 oz (64.8 kg)   SpO2 99%   BMI 27.89 kg/m   General Appearance: NAD.  Awake, conversant and cooperative. Eyes: PERRLA, EOMs intact.  Sclera white.  Conjunctiva without erythema. Sinuses: No frontal/maxillary tenderness.  No nasal discharge. Nares patent.  ENT/Mouth: Ext aud canals clear.  Bilateral TMs w/DOL and without erythema or bulging. Hearing intact.  Posterior pharynx without swelling or exudate.  Tonsils without swelling or erythema.  Neck: Supple.  No masses, nodules or thyromegaly. Respiratory: Effort is regular with non-labored breathing. Breath sounds are equal bilaterally without rales, rhonchi, wheezing or stridor.  Cardio: RRR with no MRGs. Brisk peripheral pulses without edema.  Abdomen: Active BS in all four quadrants.  Soft and non-tender without guarding, rebound tenderness, hernias or masses. Lymphatics: Non tender without lymphadenopathy.  Musculoskeletal: Full ROM, 5/5 strength, normal ambulation.  No clubbing or cyanosis. Skin: Right posterior upper thigh with flat honeycomb netting pattern and reddish to bluish in color. Spreads across the width of the thigh and radiates from just below the inferior gluteal to mid posterior thigh.  Appropriate color for ethnicity. Warm without rashes, lesions, ulcers.  Neuro: CN II-XII grossly normal. Normal muscle tone without cerebellar symptoms and intact sensation.   Psych: AO X 3,  appropriate mood and affect, insight and judgment.     Darrol Jump, NP 1:40 PM [facility]

## 2022-06-26 ENCOUNTER — Other Ambulatory Visit: Payer: Self-pay | Admitting: Family Medicine

## 2022-06-30 ENCOUNTER — Other Ambulatory Visit (HOSPITAL_COMMUNITY): Payer: Self-pay

## 2022-07-01 ENCOUNTER — Other Ambulatory Visit (HOSPITAL_COMMUNITY): Payer: Self-pay

## 2022-07-04 ENCOUNTER — Encounter: Payer: Self-pay | Admitting: Nurse Practitioner

## 2022-07-05 ENCOUNTER — Ambulatory Visit: Payer: 59 | Admitting: Nurse Practitioner

## 2022-07-08 ENCOUNTER — Other Ambulatory Visit: Payer: Self-pay | Admitting: Family Medicine

## 2022-07-08 ENCOUNTER — Other Ambulatory Visit (HOSPITAL_COMMUNITY): Payer: Self-pay

## 2022-07-11 ENCOUNTER — Other Ambulatory Visit (HOSPITAL_COMMUNITY): Payer: Self-pay

## 2022-07-11 MED ORDER — VENLAFAXINE HCL ER 75 MG PO CP24
ORAL_CAPSULE | Freq: Every day | ORAL | 3 refills | Status: DC
  Filled 2022-07-11: qty 30, 30d supply, fill #0
  Filled 2022-08-15: qty 30, 30d supply, fill #1
  Filled 2022-09-26: qty 30, 30d supply, fill #2
  Filled 2022-11-11: qty 30, 30d supply, fill #3
  Filled 2022-12-24: qty 30, 30d supply, fill #4
  Filled 2023-02-15: qty 30, 30d supply, fill #5
  Filled 2023-03-25 (×2): qty 30, 30d supply, fill #6
  Filled 2023-05-01: qty 30, 30d supply, fill #7
  Filled 2023-05-30: qty 30, 30d supply, fill #8
  Filled 2023-07-06: qty 30, 30d supply, fill #9

## 2022-07-16 ENCOUNTER — Other Ambulatory Visit (HOSPITAL_COMMUNITY): Payer: Self-pay

## 2022-08-09 ENCOUNTER — Ambulatory Visit: Payer: 59 | Admitting: Nurse Practitioner

## 2022-08-10 ENCOUNTER — Ambulatory Visit: Payer: 59 | Admitting: Nurse Practitioner

## 2022-08-10 ENCOUNTER — Encounter: Payer: Self-pay | Admitting: Nurse Practitioner

## 2022-08-10 VITALS — BP 118/72 | HR 91 | Temp 97.5°F | Ht 60.0 in | Wt 134.0 lb

## 2022-08-10 DIAGNOSIS — E663 Overweight: Secondary | ICD-10-CM

## 2022-08-10 DIAGNOSIS — Z79899 Other long term (current) drug therapy: Secondary | ICD-10-CM

## 2022-08-10 DIAGNOSIS — I10 Essential (primary) hypertension: Secondary | ICD-10-CM | POA: Diagnosis not present

## 2022-08-10 DIAGNOSIS — R231 Pallor: Secondary | ICD-10-CM

## 2022-08-10 DIAGNOSIS — E7800 Pure hypercholesterolemia, unspecified: Secondary | ICD-10-CM

## 2022-08-10 DIAGNOSIS — F411 Generalized anxiety disorder: Secondary | ICD-10-CM | POA: Diagnosis not present

## 2022-08-10 MED ORDER — WEGOVY 1 MG/0.5ML ~~LOC~~ SOAJ
1.0000 mg | SUBCUTANEOUS | 1 refills | Status: DC
  Filled 2022-08-10: qty 2, 28d supply, fill #0
  Filled 2022-09-09: qty 2, 28d supply, fill #1

## 2022-08-10 NOTE — Patient Instructions (Signed)
Semaglutide Injection (Weight Management) What is this medication? SEMAGLUTIDE (SEM a GLOO tide) promotes weight loss. It may also be used to maintain weight loss. It works by decreasing appetite. Changes to diet and exercise are often combined with this medication. This medicine may be used for other purposes; ask your health care provider or pharmacist if you have questions. COMMON BRAND NAME(S): [id number] What should I tell my care team before I take this medication? They need to know if you have any of these conditions: Endocrine tumors (MEN 2) or if someone in your family had these tumors Eye disease, vision problems Gallbladder disease History of depression or mental health disease History of pancreatitis Kidney disease Stomach or intestine problems Suicidal thoughts, plans, or attempt; a previous suicide attempt by you or a family member Thyroid cancer or if someone in your family had thyroid cancer An unusual or allergic reaction to semaglutide, other medications, foods, dyes, or preservatives Pregnant or trying to get pregnant Breast-feeding How should I use this medication? This medication is injected under the skin. You will be taught how to prepare and give it. Take it as directed on the prescription label. It is given once every week (every 7 days). Keep taking it unless your care team tells you to stop. It is important that you put your used needles and pens in a special sharps container. Do not put them in a trash can. If you do not have a sharps container, call your pharmacist or care team to get one. A special MedGuide will be given to you by the pharmacist with each prescription and refill. Be sure to read this information carefully each time. This medication comes with INSTRUCTIONS FOR USE. Ask your pharmacist for directions on how to use this medication. Read the information carefully. Talk to your pharmacist or care team if you have questions. Talk to your care team about  the use of this medication in children. While it may be prescribed for children as young as 12 years for selected conditions, precautions do apply. Overdosage: If you think you have taken too much of this medicine contact a poison control center or emergency room at once. NOTE: This medicine is only for you. Do not share this medicine with others. What if I miss a dose? If you miss a dose and the next scheduled dose is more than 2 days away, take the missed dose as soon as possible. If you miss a dose and the next scheduled dose is less than 2 days away, do not take the missed dose. Take the next dose at your regular time. Do not take double or extra doses. If you miss your dose for 2 weeks or more, take the next dose at your regular time or call your care team to talk about how to restart this medication. What may interact with this medication? Insulin and other medications for diabetes This list may not describe all possible interactions. Give your health care provider a list of all the medicines, herbs, non-prescription drugs, or dietary supplements you use. Also tell them if you smoke, drink alcohol, or use illegal drugs. Some items may interact with your medicine. What should I watch for while using this medication? Visit your care team for regular checks on your progress. It may be some time before you see the benefit from this medication. Drink plenty of fluids while taking this medication. Check with your care team if you have severe diarrhea, nausea, and vomiting, or if you sweat a  lot. The loss of too much body fluid may make it dangerous for you to take this medication. This medication may affect blood sugar levels. Ask your care team if changes in diet or medications are needed if you have diabetes. If you or your family notice any changes in your behavior, such as new or worsening depression, thoughts of harming yourself, anxiety, other unusual or disturbing thoughts, or memory loss, call  your care team right away. Women should inform their care team if they wish to become pregnant or think they might be pregnant. Losing weight while pregnant is not advised and may cause harm to the unborn child. Talk to your care team for more information. What side effects may I notice from receiving this medication? Side effects that you should report to your care team as soon as possible: Allergic reactions--skin rash, itching, hives, swelling of the face, lips, tongue, or throat Change in vision Dehydration--increased thirst, dry mouth, feeling faint or lightheaded, headache, dark yellow or brown urine Gallbladder problems--severe stomach pain, nausea, vomiting, fever Heart palpitations--rapid, pounding, or irregular heartbeat Kidney injury--decrease in the amount of urine, swelling of the ankles, hands, or feet Pancreatitis--severe stomach pain that spreads to your back or gets worse after eating or when touched, fever, nausea, vomiting Thoughts of suicide or self-harm, worsening mood, feelings of depression Thyroid cancer--new mass or lump in the neck, pain or trouble swallowing, trouble breathing, hoarseness Side effects that usually do not require medical attention (report to your care team if they continue or are bothersome): Diarrhea Loss of appetite Nausea Stomach pain Vomiting This list may not describe all possible side effects. Call your doctor for medical advice about side effects. You may report side effects to FDA at 1-800-FDA-1088. Where should I keep my medication? Keep out of the reach of children and pets. Refrigeration (preferred): Store in the refrigerator. Do not freeze. Keep this medication in the original container until you are ready to take it. Get rid of any unused medication after the expiration date. Room temperature: If needed, prior to cap removal, the pen can be stored at room temperature for up to 28 days. Protect from light. If it is stored at room  temperature, get rid of any unused medication after 28 days or after it expires, whichever is first. It is important to get rid of the medication as soon as you no longer need it or it is expired. You can do this in two ways: Take the medication to a medication take-back program. Check with your pharmacy or law enforcement to find a location. If you cannot return the medication, follow the directions in the MedGuide. NOTE: This sheet is a summary. It may not cover all possible information. If you have questions about this medicine, talk to your doctor, pharmacist, or health care provider.  2023 Elsevier/Gold Standard (2021-02-18 00:00:00)  

## 2022-08-10 NOTE — Progress Notes (Signed)
Assessment and Plan:  Wendy [name] was seen today for a follow up.  Diagnoses and all order for this visit:  1. Hypertension Controlled  Continue weight loss. Continue lifestyle modifications. Discussed DASH (Dietary Approaches to Stop Hypertension) DASH diet is lower in sodium than a typical American diet. Cut back on foods that are high in saturated fat, cholesterol, and trans fats. Eat more whole-grain foods, fish, poultry, and nuts Remain active and exercise as tolerated daily.  Monitor BP at home-Call if greater than 130/80.    2. Overweight (BMI 25.0-29.9) Achieving recommended BMI Discussed appropriate BMI Continue Wegovy for further weigh loss with comorbidity. Diet modification. Physical activity. Encouraged/praised to build confidence.   3. Generalized anxiety disorder Controlled. Continue Venlafaxine. Continue to monitor  4.   Livedo reticularis  No recent flare Continue to monitor  5.  Medication management All medications discussed and reviewed in full. All questions and concerns regarding medications addressed.     Meds ordered this encounter  Medications   Semaglutide-Weight Management (WEGOVY) 1 MG/0.5ML SOAJ    Sig: Inject 1 mg into the skin once a week.    Dispense:  2 mL    Refill:  1    Order Specific Question:   Supervising Provider    Answer:   Unk Pinto [phone]     Notify office for further evaluation and treatment, questions or concerns if s/s fail to improve. The risks and benefits of my recommendations, as well as other treatment options were discussed with the patient today. Questions were answered.  Further disposition pending results of labs. Discussed med's effects and SE's.    Over 20 minutes of exam, counseling, chart review, and critical decision making was performed.   Future Appointments  Date Time Provider [place]  07/05/2022  3:30 PM Darrol Jump, NP [facility] None    HPI BP 116/90   Pulse 82    Temp 97.9 F (36.6 C)   Wt 142 lb 12.8 oz (64.8 kg)   SpO2 99%   BMI 27.89 kg/m   41 y.o.female presents for a general follow up on weight loss.  She has also been working on diet and exercise.  She has lost 3 lb over the last month.  She is interested in continuing to lose weigh for overall health and reduction in any further comorbidity.   BMI is Body mass index is 27.89 kg/m., she has been working on diet and exercise.  She has lost 8 lb since last OV.   Wt Readings from Last 3 Encounters:  08/10/22 134 lb (60.8 kg)  06/07/22 142 lb 12.8 oz (64.8 kg)  05/05/22 145 lb 3.2 oz (65.9 kg)    Past Medical History:  Diagnosis Date   Abnormal Pap smear    Anemia    Bell's palsy    Female infertility associated with anovulation    Hx of varicella    Hypertension    Induction of Labor 10/28/2013   Migraine    Pregnancy induced hypertension    labetolol til     No Known Allergies  Current Outpatient Medications on File Prior to Visit  Medication Sig   acetaminophen (TYLENOL) 500 MG tablet Take 500 mg by mouth every 6 (six) hours as needed.   ibuprofen (ADVIL,MOTRIN) 600 MG tablet Take 1 tablet (600 mg total) by mouth every 6 (six) hours.   Semaglutide-Weight Management (WEGOVY) 0.5 MG/0.5ML SOAJ Inject 0.5 mg into the skin once a week. (Patient not taking: Reported on 06/07/2022)  venlafaxine XR (EFFEXOR-XR) 75 MG 24 hr capsule TAKE 1 CAPSULE BY MOUTH ONCE DAILY WITH BREAKFAST   No current facility-administered medications on file prior to visit.    ROS: all negative except what is noted in the HPI.    Physical Exam:  BP 116/90   Pulse 82   Temp 97.9 F (36.6 C)   Wt 142 lb 12.8 oz (64.8 kg)   SpO2 99%   BMI 27.89 kg/m   General Appearance: NAD.  Awake, conversant and cooperative. Eyes: PERRLA, EOMs intact.  Sclera white.  Conjunctiva without erythema. Sinuses: No frontal/maxillary tenderness.  No nasal discharge. Nares patent.  ENT/Mouth: Ext aud canals  clear.  Bilateral TMs w/DOL and without erythema or bulging. Hearing intact.  Posterior pharynx without swelling or exudate.  Tonsils without swelling or erythema.  Neck: Supple.  No masses, nodules or thyromegaly. Respiratory: Effort is regular with non-labored breathing. Breath sounds are equal bilaterally without rales, rhonchi, wheezing or stridor.  Cardio: RRR with no MRGs. Brisk peripheral pulses without edema.  Abdomen: Active BS in all four quadrants.  Soft and non-tender without guarding, rebound tenderness, hernias or masses. Lymphatics: Non tender without lymphadenopathy.  Musculoskeletal: Full ROM, 5/5 strength, normal ambulation.  No clubbing or cyanosis. Skin: Right posterior upper thigh with flat honeycomb netting pattern and reddish to bluish in color. Spreads across the width of the thigh and radiates from just below the inferior gluteal to mid posterior thigh.  Appropriate color for ethnicity. Warm without rashes, lesions, ulcers.  Neuro: CN II-XII grossly normal. Normal muscle tone without cerebellar symptoms and intact sensation.   Psych: AO X 3,  appropriate mood and affect, insight and judgment.     Darrol Jump, NP 1:40 PM [facility]

## 2022-08-11 ENCOUNTER — Other Ambulatory Visit (HOSPITAL_COMMUNITY): Payer: Self-pay

## 2022-08-15 ENCOUNTER — Other Ambulatory Visit (HOSPITAL_COMMUNITY): Payer: Self-pay

## 2022-09-09 ENCOUNTER — Other Ambulatory Visit (HOSPITAL_COMMUNITY): Payer: Self-pay

## 2022-09-19 ENCOUNTER — Other Ambulatory Visit (HOSPITAL_COMMUNITY): Payer: Self-pay

## 2022-09-26 ENCOUNTER — Other Ambulatory Visit (HOSPITAL_COMMUNITY): Payer: Self-pay

## 2022-11-11 ENCOUNTER — Other Ambulatory Visit (HOSPITAL_COMMUNITY): Payer: Self-pay

## 2022-11-22 ENCOUNTER — Ambulatory Visit: Payer: 59 | Admitting: Nurse Practitioner

## 2022-11-22 ENCOUNTER — Other Ambulatory Visit (HOSPITAL_COMMUNITY): Payer: Self-pay

## 2022-11-22 ENCOUNTER — Other Ambulatory Visit: Payer: Self-pay | Admitting: Nurse Practitioner

## 2022-11-22 DIAGNOSIS — I10 Essential (primary) hypertension: Secondary | ICD-10-CM

## 2022-11-22 DIAGNOSIS — Z79899 Other long term (current) drug therapy: Secondary | ICD-10-CM

## 2022-11-22 DIAGNOSIS — E663 Overweight: Secondary | ICD-10-CM

## 2022-11-22 MED ORDER — WEGOVY 1 MG/0.5ML ~~LOC~~ SOAJ
1.0000 mg | SUBCUTANEOUS | 1 refills | Status: DC
  Filled 2022-11-22: qty 2, 28d supply, fill #0

## 2022-11-29 ENCOUNTER — Ambulatory Visit: Payer: 59 | Admitting: Nurse Practitioner

## 2022-11-29 ENCOUNTER — Encounter: Payer: Self-pay | Admitting: Nurse Practitioner

## 2022-11-29 VITALS — BP 112/80 | HR 78 | Temp 97.5°F | Wt 127.8 lb

## 2022-11-29 DIAGNOSIS — E6609 Other obesity due to excess calories: Secondary | ICD-10-CM

## 2022-11-29 DIAGNOSIS — Z6824 Body mass index (BMI) 24.0-24.9, adult: Secondary | ICD-10-CM

## 2022-11-29 NOTE — Patient Instructions (Signed)

## 2022-11-29 NOTE — Progress Notes (Signed)
Assessment and Plan:  Wendy [name] was seen today for a follow up.  Diagnoses and all order for this visit:  Hypertension Controlled  Continue weight loss. Continue lifestyle modifications. Discussed DASH (Dietary Approaches to Stop Hypertension) DASH diet is lower in sodium than a typical American diet. Cut back on foods that are high in saturated fat, cholesterol, and trans fats. Eat more whole-grain foods, fish, poultry, and nuts Remain active and exercise as tolerated daily.  Monitor BP at home-Call if greater than 130/80.    BMI 24.0-24.99 Weight achieved. Discussed appropriate BMI Continue Wegovy for further weigh loss with comorbidity. Diet modification. Physical activity. Encouraged/praised to build confidence.   Generalized anxiety disorder Controlled. Continue Venlafaxine. Continue to monitor  Livedo reticularis  No recent flare-resolving Continue to monitor  Medication management All medications discussed and reviewed in full. All questions and concerns regarding medications addressed.    Review of recent blood work is stable. Plan to re-evaluate in 6 mo during CPE.  No orders of the defined types were placed in this encounter.  Notify office for further evaluation and treatment, questions or concerns if s/s fail to improve. The risks and benefits of my recommendations, as well as other treatment options were discussed with the patient today. Questions were answered.  Further disposition pending results of labs. Discussed med's effects and SE's.    Over 20 minutes of exam, counseling, chart review, and critical decision making was performed.   Future Appointments  Date Time Provider [place]  07/05/2022  3:30 PM Darrol Jump, NP [facility] None    HPI BP 116/90   Pulse 82   Temp 97.9 F (36.6 C)   Wt 142 lb 12.8 oz (64.8 kg)   SpO2 99%   BMI 27.89 kg/m   41 y.o.female presents for a general follow.    Overall she reports  feeling well, although her grandmother just passed unexpectedly on Thanksgiving.  She is grieving and has moments of crying but overall mood is stable.  She continues on Effexor.   She was though to have livedo reticularis but feels this was r/t using the heater under her desk at work.  She is no longer using and feels as though there has been an improvement in the skin.    She has also been working on diet and exercise.  Now down to a healthy BMI.  She is continuing to incorporate lifestyle modifications.  BMI is Body mass index is 24.96 kg/m., she has been working on diet and exercise. Wt Readings from Last 3 Encounters:  11/29/22 127 lb 12.8 oz (58 kg)  08/10/22 134 lb (60.8 kg)  06/07/22 142 lb 12.8 oz (64.8 kg)    Past Medical History:  Diagnosis Date   Abnormal Pap smear    Anemia    Bell's palsy    Female infertility associated with anovulation    Hx of varicella    Hypertension    Induction of Labor 10/28/2013   Migraine    Pregnancy induced hypertension    labetolol til   No Known Allergies  Current Outpatient Medications on File Prior to Visit  Medication Sig Dispense Refill   acetaminophen (TYLENOL) 500 MG tablet Take 500 mg by mouth every 6 (six) hours as needed.     ibuprofen (ADVIL,MOTRIN) 600 MG tablet Take 1 tablet (600 mg total) by mouth every 6 (six) hours. 30 tablet 0   Semaglutide-Weight Management (WEGOVY) 1 MG/0.5ML SOAJ Inject 1 mg into the skin once a  week. 2 mL 1   venlafaxine XR (EFFEXOR-XR) 75 MG 24 hr capsule TAKE 1 CAPSULE BY MOUTH ONCE DAILY WITH BREAKFAST 90 capsule 3   No current facility-administered medications on file prior to visit.      ROS: all negative except what is noted in the HPI.    Physical Exam:  BP 116/90   Pulse 82   Temp 97.9 F (36.6 C)   Wt 142 lb 12.8 oz (64.8 kg)   SpO2 99%   BMI 27.89 kg/m   General Appearance: NAD.  Awake, conversant and cooperative. Eyes: PERRLA, EOMs intact.  Sclera white.  Conjunctiva  without erythema. Sinuses: No frontal/maxillary tenderness.  No nasal discharge. Nares patent.  ENT/Mouth: Ext aud canals clear.  Bilateral TMs w/DOL and without erythema or bulging. Hearing intact.  Posterior pharynx without swelling or exudate.  Tonsils without swelling or erythema.  Neck: Supple.  No masses, nodules or thyromegaly. Respiratory: Effort is regular with non-labored breathing. Breath sounds are equal bilaterally without rales, rhonchi, wheezing or stridor.  Cardio: RRR with no MRGs. Brisk peripheral pulses without edema.  Abdomen: Active BS in all four quadrants.  Soft and non-tender without guarding, rebound tenderness, hernias or masses. Lymphatics: Non tender without lymphadenopathy.  Musculoskeletal: Full ROM, 5/5 strength, normal ambulation.  No clubbing or cyanosis. Skin: Right posterior upper thigh with flat honeycomb netting pattern and reddish to bluish in color. Spreads across the width of the thigh and radiates from just below the inferior gluteal to mid posterior thigh.  Appropriate color for ethnicity. Warm without rashes, lesions, ulcers.  Neuro: CN II-XII grossly normal. Normal muscle tone without cerebellar symptoms and intact sensation.   Psych: AO X 3,  appropriate mood and affect, insight and judgment.     Darrol Jump, NP 1:40 PM [facility]

## 2023-01-04 ENCOUNTER — Telehealth: Payer: Self-pay | Admitting: Nurse Practitioner

## 2023-01-04 NOTE — Telephone Encounter (Signed)
Patient is requesting a refill on Wegovy but is ready to increase to the next dose. Please send to [facility]

## 2023-01-05 ENCOUNTER — Other Ambulatory Visit: Payer: Self-pay | Admitting: Nurse Practitioner

## 2023-01-05 MED ORDER — WEGOVY 1.7 MG/0.75ML ~~LOC~~ SOAJ
1.7000 mg | SUBCUTANEOUS | 2 refills | Status: DC
  Filled 2023-01-11: qty 3, 28d supply, fill #0
  Filled 2023-03-02: qty 3, 28d supply, fill #1
  Filled 2023-05-01: qty 3, 28d supply, fill #2

## 2023-01-11 ENCOUNTER — Other Ambulatory Visit (HOSPITAL_COMMUNITY): Payer: Self-pay

## 2023-01-12 ENCOUNTER — Other Ambulatory Visit (HOSPITAL_COMMUNITY): Payer: Self-pay

## 2023-03-25 ENCOUNTER — Other Ambulatory Visit (HOSPITAL_COMMUNITY): Payer: Self-pay

## 2023-05-03 ENCOUNTER — Encounter: Payer: Self-pay | Admitting: Nurse Practitioner

## 2023-05-03 ENCOUNTER — Ambulatory Visit: Payer: 59 | Admitting: Nurse Practitioner

## 2023-05-03 ENCOUNTER — Other Ambulatory Visit (HOSPITAL_COMMUNITY): Payer: Self-pay

## 2023-05-03 VITALS — BP 138/80 | HR 76 | Temp 97.1°F | Ht 60.0 in | Wt 134.6 lb

## 2023-05-03 DIAGNOSIS — L299 Pruritus, unspecified: Secondary | ICD-10-CM

## 2023-05-03 DIAGNOSIS — L237 Allergic contact dermatitis due to plants, except food: Secondary | ICD-10-CM | POA: Diagnosis not present

## 2023-05-03 MED ORDER — DEXAMETHASONE 1 MG PO TABS
ORAL_TABLET | ORAL | 0 refills | Status: DC
Start: 2023-05-03 — End: 2023-06-08
  Filled 2023-05-03: qty 20, 11d supply, fill #0

## 2023-05-03 MED ORDER — HYDROXYZINE HCL 10 MG PO TABS
10.0000 mg | ORAL_TABLET | Freq: Three times a day (TID) | ORAL | 0 refills | Status: DC | PRN
Start: 2023-05-03 — End: 2023-06-08
  Filled 2023-05-03: qty 30, 10d supply, fill #0

## 2023-05-03 MED ORDER — IBUPROFEN 800 MG PO TABS
800.0000 mg | ORAL_TABLET | Freq: Three times a day (TID) | ORAL | 0 refills | Status: DC | PRN
Start: 2023-05-03 — End: 2023-06-08
  Filled 2023-05-03: qty 30, 10d supply, fill #0

## 2023-05-03 NOTE — Progress Notes (Signed)
Assessment and Plan:  IRMALINDA RADERMACHER was seen today for a .  Diagnoses and all order for this visit:  Poison ivy dermatitis/Pruritus  Continue Doxycycline in full as directed. Change Benadryl to Hydroxyzine Start Dexamethasone taper Continue Betamethasone topical cream Start IBU for inflammation Discussed oatmeal bath Continue to monitor for any increase in infection including spreading of redness, streaking, fever, N/V - contact office if noticed.   - hydrOXYzine (ATARAX) 10 MG tablet; Take 1 tablet (10 mg total) by mouth 3 (three) times daily as needed.  Dispense: 30 tablet; Refill: 0 - dexamethasone (DECADRON) 1 MG tablet; Take 3 tabs for 3 days, 2 tabs for 3 days 1 tab for 5 days. Take with food.  Dispense: 20 tablet; Refill: 0 - ibuprofen (ADVIL) 800 MG tablet; Take 1 tablet (800 mg total) by mouth every 8 (eight) hours as needed.  Dispense: 30 tablet; Refill: 0  Notify office for further evaluation and treatment, questions or concerns if s/s fail to improve. The risks and benefits of my recommendations, as well as other treatment options were discussed with the patient today. Questions were answered.  Further disposition pending results of labs. Discussed med's effects and SE's.    Over 15 minutes of exam, counseling, chart review, and critical decision making was performed.   Future Appointments  Date Time Provider Department Center  06/08/2023  3:00 PM [place] Prue, Archie Patten, NP [facility] None    ------------------------------------------------------------------------------------------------------------------   HPI BP 138/80   Pulse 76   Temp (!) 97.1 F (36.2 C)   Ht 5' (1.524 m)   Wt 134 lb 9.6 oz (61.1 kg)   SpO2 98%   BMI 26.29 kg/m   42 y.o.female presents for evaluation of poison ivy treatment. Reports that she has had for 3 weeks.  She has been treated at [facility] x2 with steroid taper, steroid injection, betamethasone topical cream, and doxycycline.  She continues to  have rash on her BUE, not notably on her forearms and BLE most notably medial thighs.  She denies fever, chills, N/V, abdominal pain.   Past Medical History:  Diagnosis Date   Abnormal Pap smear    Anemia    Bell's palsy    Female infertility associated with anovulation    Hx of varicella    Hypertension    Induction of Labor 10/28/2013   Migraine    Pregnancy induced hypertension    labetolol til     No Known Allergies  Current Outpatient Medications on File Prior to Visit  Medication Sig   Semaglutide-Weight Management (WEGOVY) 1.7 MG/0.75ML SOAJ Inject 1.7 mg into the skin once a week.   venlafaxine XR (EFFEXOR-XR) 75 MG 24 hr capsule TAKE 1 CAPSULE BY MOUTH ONCE DAILY WITH BREAKFAST   acetaminophen (TYLENOL) 500 MG tablet Take 500 mg by mouth every 6 (six) hours as needed. (Patient not taking: Reported on 05/03/2023)   ibuprofen (ADVIL,MOTRIN) 600 MG tablet Take 1 tablet (600 mg total) by mouth every 6 (six) hours. (Patient not taking: Reported on 05/03/2023)   No current facility-administered medications on file prior to visit.    ROS: all negative except what is noted in the HPI.   Physical Exam:  BP 138/80   Pulse 76   Temp (!) 97.1 F (36.2 C)   Ht 5' (1.524 m)   Wt 134 lb 9.6 oz (61.1 kg)   SpO2 98%   BMI 26.29 kg/m   General Appearance: NAD.  Awake, conversant and cooperative. Eyes: PERRLA, EOMs intact.  Sclera white.  Conjunctiva without erythema. Sinuses: No frontal/maxillary tenderness.  No nasal discharge. Nares patent.  ENT/Mouth: Ext aud canals clear.  Bilateral TMs w/DOL and without erythema or bulging. Hearing intact.  Posterior pharynx without swelling or exudate.  Tonsils without swelling or erythema.  Neck: Supple.  No masses, nodules or thyromegaly. Respiratory: Effort is regular with non-labored breathing. Breath sounds are equal bilaterally without rales, rhonchi, wheezing or stridor.  Cardio: RRR with no MRGs. Brisk peripheral pulses without  edema.  Abdomen: Active BS in all four quadrants.  Soft and non-tender without guarding, rebound tenderness, hernias or masses. Lymphatics: Non tender without lymphadenopathy.  Musculoskeletal: Full ROM, 5/5 strength, normal ambulation.  No clubbing or cyanosis. Skin: BUE anteiror forearms and BLE medial thighs with scattered vesicle type dried rash with underlying erythema. Warm.  Appropriate color for ethnicity. Appropriate color for ethnicity. Warm without rashes, lesions, ecchymosis, ulcers.  Neuro: CN II-XII grossly normal. Normal muscle tone without cerebellar symptoms and intact sensation.   Psych: AO X 3,  appropriate mood and affect, insight and judgment.     Adela Glimpse, NP 10:09 AM [facility]

## 2023-05-03 NOTE — Patient Instructions (Signed)
Poison Ivy Dermatitis Poison ivy dermatitis is redness and soreness of the skin caused by chemicals in the leaves of the poison ivy plant. You may have very bad itching, swelling, a rash, and blisters. What are the causes? Touching a poison ivy plant. Touching something that has the chemical on it. This may include animals or objects that have come in contact with the plant. What increases the risk? Going outdoors often in wooded or marshy areas. Going outdoors without wearing protective clothing, such as closed shoes, long pants, and a long-sleeved shirt. What are the signs or symptoms?  Skin redness. Very bad itching. A rash that often includes bumps and blisters. The rash usually appears 48 hours after exposure, if you have had it before. If this is the first time you have it, the rash may not appear until a week after exposure. Swelling. This may occur if the reaction is very bad. Symptoms usually last for 1-2 weeks. The first time you get this condition, symptoms may last 3-4 weeks. How is this treated? This condition may be treated with: Hydrocortisone cream or calamine lotion to relieve itching. Oatmeal baths to soothe the skin. Medicines, such as over-the-counter antihistamine tablets. Oral steroid medicine for very bad reactions. Follow these instructions at home: Medicines Take or apply over-the-counter and prescription medicines only as told by your doctor. Use hydrocortisone cream or calamine lotion as needed to help with itching. General instructions Do not scratch or rub your skin. Put a cold, wet cloth (cold compress) on the affected areas or take baths in cool water. This will help with itching. Avoid hot baths and showers. Take oatmeal baths as needed. Use colloidal oatmeal. You can get this at a pharmacy or grocery store. Follow the instructions on the package. While you have the rash, wash your clothes right after you wear them. Check the affected area every day  for signs of infection. Check for: More redness, swelling, or pain. Fluid or blood. Warmth. Pus or a bad smell. Keep all follow-up visits. Your doctor may want to see how your skin is doing with treatment. How is this prevented?  Know what poison ivy looks like, so you can avoid it. This plant has three leaves with flowering branches on a single stem. The leaves are glossy. The leaves have uneven edges that come to a point. If you touch poison ivy, wash your skin with soap and water right away. Be sure to wash under your fingernails. When hiking or camping, wear long pants, a long-sleeved shirt, long socks, and hiking boots. You can also use a lotion on your skin that helps to prevent contact with poison ivy. If you think that your clothes or outdoor gear came in contact with poison ivy, rinse them off with a garden hose before you bring them inside your house. When doing yard work or gardening, wear gloves, long sleeves, long pants, and boots. Wash your garden tools and gloves if they come in contact with poison ivy. If you think that your pet has come into contact with poison ivy, wash them with pet shampoo and water. Make sure to wear gloves while washing your pet. Contact a doctor if: You have open sores in the rash area. You have any signs of infection. You have redness that spreads past the rash area. You have a fever. You have a rash over a large area of your body. You have a rash on your eyes, mouth, or genitals. Your rash does not get better after   a few weeks. Get help right away if: Your face swells or your eyes swell shut. You have trouble breathing. You have trouble swallowing. These symptoms may be an emergency. Do not wait to see if the symptoms will go away. Get help right away. Call 911. This information is not intended to replace advice given to you by your health care provider. Make sure you discuss any questions you have with your health care provider. Document  Revised: 05/05/2022 Document Reviewed: 05/05/2022 Elsevier Patient Education  2023 [employer]  

## 2023-06-08 ENCOUNTER — Encounter: Payer: Self-pay | Admitting: Nurse Practitioner

## 2023-06-08 ENCOUNTER — Ambulatory Visit (INDEPENDENT_AMBULATORY_CARE_PROVIDER_SITE_OTHER): Payer: 59 | Admitting: Nurse Practitioner

## 2023-06-08 VITALS — BP 138/86 | HR 76 | Temp 97.9°F | Ht 60.75 in | Wt 129.6 lb

## 2023-06-08 DIAGNOSIS — Z131 Encounter for screening for diabetes mellitus: Secondary | ICD-10-CM

## 2023-06-08 DIAGNOSIS — F411 Generalized anxiety disorder: Secondary | ICD-10-CM

## 2023-06-08 DIAGNOSIS — Z79899 Other long term (current) drug therapy: Secondary | ICD-10-CM

## 2023-06-08 DIAGNOSIS — Z1389 Encounter for screening for other disorder: Secondary | ICD-10-CM

## 2023-06-08 DIAGNOSIS — Z0000 Encounter for general adult medical examination without abnormal findings: Secondary | ICD-10-CM

## 2023-06-08 DIAGNOSIS — Z1329 Encounter for screening for other suspected endocrine disorder: Secondary | ICD-10-CM

## 2023-06-08 DIAGNOSIS — Z1283 Encounter for screening for malignant neoplasm of skin: Secondary | ICD-10-CM

## 2023-06-08 DIAGNOSIS — Z136 Encounter for screening for cardiovascular disorders: Secondary | ICD-10-CM

## 2023-06-08 DIAGNOSIS — M545 Low back pain, unspecified: Secondary | ICD-10-CM

## 2023-06-08 DIAGNOSIS — Z13220 Encounter for screening for lipoid disorders: Secondary | ICD-10-CM

## 2023-06-08 DIAGNOSIS — E538 Deficiency of other specified B group vitamins: Secondary | ICD-10-CM

## 2023-06-08 DIAGNOSIS — E559 Vitamin D deficiency, unspecified: Secondary | ICD-10-CM

## 2023-06-08 DIAGNOSIS — Z0001 Encounter for general adult medical examination with abnormal findings: Secondary | ICD-10-CM

## 2023-06-08 DIAGNOSIS — I10 Essential (primary) hypertension: Secondary | ICD-10-CM | POA: Diagnosis not present

## 2023-06-08 LAB — CBC WITH DIFFERENTIAL/PLATELET
Basophils Relative: 0.7 %
Hemoglobin: 13 g/dL (ref 11.7–15.5)
MCH: 32 pg (ref 27.0–33.0)
MCV: 95.3 fL (ref 80.0–100.0)
Neutro Abs: 4659 cells/uL (ref 1500–7800)
Neutrophils Relative %: 61.3 %

## 2023-06-08 LAB — COMPLETE METABOLIC PANEL WITH GFR
Creat: 0.57 mg/dL (ref 0.50–0.99)
Total Bilirubin: 1.4 mg/dL — ABNORMAL HIGH (ref 0.2–1.2)

## 2023-06-08 LAB — LIPID PANEL: HDL: 72 mg/dL (ref >=50)

## 2023-06-08 NOTE — Patient Instructions (Signed)
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
## 2023-06-09 LAB — COMPLETE METABOLIC PANEL WITH GFR
AG Ratio: 1.6 (calc) (ref 1.0–2.5)
ALT: 15 U/L (ref 6–29)
AST: 18 U/L (ref 10–30)
Albumin: 4.2 g/dL (ref 3.6–5.1)
Alkaline phosphatase (APISO): 56 U/L (ref 31–125)
BUN: 11 mg/dL (ref 7–25)
CO2: 28 mmol/L (ref 20–32)
Calcium: 9.3 mg/dL (ref 8.6–10.2)
Chloride: 104 mmol/L (ref 98–110)
Globulin: 2.6 g/dL (calc) (ref 1.9–3.7)
Glucose, Bld: 93 mg/dL (ref 65–99)
Potassium: 3.8 mmol/L (ref 3.5–5.3)
Sodium: 138 mmol/L (ref 135–146)
Total Protein: 6.8 g/dL (ref 6.1–8.1)
eGFR: 118 mL/min/{1.73_m2} (ref >=60)

## 2023-06-09 LAB — LIPID PANEL
Cholesterol: 187 mg/dL (ref <200)
LDL Cholesterol (Calc): 102 mg/dL (calc) — ABNORMAL HIGH
Non-HDL Cholesterol (Calc): 115 mg/dL (calc) (ref <130)
Total CHOL/HDL Ratio: 2.6 (calc) (ref <5.0)
Triglycerides: 45 mg/dL (ref <150)

## 2023-06-09 LAB — VITAMIN B12: Vitamin B-12: 506 pg/mL (ref 200–1100)

## 2023-06-09 LAB — CBC WITH DIFFERENTIAL/PLATELET
Absolute Monocytes: 547 cells/uL (ref 200–950)
Basophils Absolute: 53 cells/uL (ref 0–200)
Eosinophils Absolute: 38 cells/uL (ref 15–500)
Eosinophils Relative: 0.5 %
HCT: 38.7 % (ref 35.0–45.0)
Lymphs Abs: 2303 cells/uL (ref 850–3900)
MCHC: 33.6 g/dL (ref 32.0–36.0)
MPV: 9.4 fL (ref 7.5–12.5)
Monocytes Relative: 7.2 %
Platelets: 324 10*3/uL (ref 140–400)
RBC: 4.06 10*6/uL (ref 3.80–5.10)
RDW: 12 % (ref 11.0–15.0)
Total Lymphocyte: 30.3 %
WBC: 7.6 10*3/uL (ref 3.8–10.8)

## 2023-06-09 LAB — URINALYSIS, ROUTINE W REFLEX MICROSCOPIC
Bilirubin Urine: NEGATIVE
Glucose, UA: NEGATIVE
Hgb urine dipstick: NEGATIVE
Ketones, ur: NEGATIVE
Leukocytes,Ua: NEGATIVE
Nitrite: NEGATIVE
Protein, ur: NEGATIVE
Specific Gravity, Urine: 1.016 (ref 1.001–1.035)
pH: 7 (ref 5.0–8.0)

## 2023-06-09 LAB — MICROALBUMIN / CREATININE URINE RATIO
Creatinine, Urine: 84 mg/dL (ref 20–275)
Microalb, Ur: <0.2 mg/dL

## 2023-06-09 LAB — INSULIN, RANDOM: Insulin: 6.6 u[IU]/mL

## 2023-06-09 LAB — TSH: TSH: 1 mIU/L

## 2023-06-09 LAB — HEMOGLOBIN A1C
Hgb A1c MFr Bld: 5.2 % of total Hgb (ref <5.7)
Mean Plasma Glucose: 103 mg/dL
eAG (mmol/L): 5.7 mmol/L

## 2023-06-09 LAB — VITAMIN D 25 HYDROXY (VIT D DEFICIENCY, FRACTURES): Vit D, 25-Hydroxy: 23 ng/mL — ABNORMAL LOW (ref 30–100)

## 2023-06-09 LAB — MAGNESIUM: Magnesium: 2 mg/dL (ref 1.5–2.5)

## 2023-07-06 ENCOUNTER — Other Ambulatory Visit (HOSPITAL_COMMUNITY): Payer: Self-pay

## 2023-08-07 ENCOUNTER — Encounter: Payer: Self-pay | Admitting: Nurse Practitioner

## 2023-08-10 MED ORDER — WEGOVY 2.4 MG/0.75ML ~~LOC~~ SOAJ: 2.4000 mg | SUBCUTANEOUS | 2 refills | Status: DC

## 2023-08-16 ENCOUNTER — Other Ambulatory Visit (HOSPITAL_COMMUNITY): Payer: Self-pay

## 2023-08-16 ENCOUNTER — Other Ambulatory Visit: Payer: Self-pay | Admitting: Nurse Practitioner

## 2023-08-17 ENCOUNTER — Other Ambulatory Visit: Payer: Self-pay

## 2023-08-17 ENCOUNTER — Other Ambulatory Visit (HOSPITAL_COMMUNITY): Payer: Self-pay

## 2023-08-17 ENCOUNTER — Other Ambulatory Visit: Payer: Self-pay | Admitting: Nurse Practitioner

## 2023-08-17 MED ORDER — VENLAFAXINE HCL ER 75 MG PO CP24: ORAL_CAPSULE | Freq: Every day | ORAL | 3 refills | Status: AC

## 2023-10-03 ENCOUNTER — Other Ambulatory Visit (HOSPITAL_COMMUNITY): Payer: Self-pay

## 2023-10-03 ENCOUNTER — Encounter: Payer: Self-pay | Admitting: Nurse Practitioner

## 2023-10-03 ENCOUNTER — Other Ambulatory Visit: Payer: Self-pay

## 2023-10-03 MED ORDER — WEGOVY 2.4 MG/0.75ML ~~LOC~~ SOAJ
2.4000 mg | SUBCUTANEOUS | 2 refills | Status: AC
  Filled 2023-10-03: qty 3, 28d supply, fill #0
  Filled 2023-11-24: qty 3, 28d supply, fill #1
  Filled 2024-01-15: qty 3, 28d supply, fill #2

## 2023-10-06 ENCOUNTER — Other Ambulatory Visit (HOSPITAL_COMMUNITY): Payer: Self-pay

## 2023-10-07 ENCOUNTER — Other Ambulatory Visit (HOSPITAL_COMMUNITY): Payer: Self-pay

## 2023-10-09 ENCOUNTER — Other Ambulatory Visit (HOSPITAL_COMMUNITY): Payer: Self-pay

## 2023-10-11 ENCOUNTER — Other Ambulatory Visit (HOSPITAL_COMMUNITY): Payer: Self-pay

## 2023-10-11 NOTE — Telephone Encounter (Signed)
PA complete and submitted.

## 2023-10-16 ENCOUNTER — Other Ambulatory Visit (HOSPITAL_COMMUNITY): Payer: Self-pay

## 2023-12-26 ENCOUNTER — Encounter: Payer: Self-pay | Admitting: Nurse Practitioner

## 2023-12-27 MED ORDER — ALBUTEROL SULFATE HFA 108 (90 BASE) MCG/ACT IN AERS: 2.0000 | INHALATION_SPRAY | Freq: Four times a day (QID) | RESPIRATORY_TRACT | 2 refills | Status: AC | PRN

## 2023-12-27 MED ORDER — ONDANSETRON 4 MG PO TBDP: 4.0000 mg | ORAL_TABLET | Freq: Three times a day (TID) | ORAL | 0 refills | Status: AC | PRN

## 2024-04-10 ENCOUNTER — Other Ambulatory Visit (HOSPITAL_COMMUNITY): Payer: Self-pay

## 2024-04-10 MED ORDER — WEGOVY 1 MG/0.5ML ~~LOC~~ SOAJ
1.0000 mg | SUBCUTANEOUS | 0 refills | Status: AC
Start: 2024-04-10 — End: ?
  Filled 2024-04-10: qty 2, 28d supply, fill #0

## 2024-06-03 ENCOUNTER — Other Ambulatory Visit (HOSPITAL_COMMUNITY): Payer: Self-pay

## 2024-06-03 MED ORDER — WEGOVY 1 MG/0.5ML ~~LOC~~ SOAJ
1.0000 mg | SUBCUTANEOUS | 1 refills | Status: AC
  Filled 2024-06-03 – 2024-06-24 (×2): qty 2, 28d supply, fill #0
  Filled 2024-10-03 – 2024-11-01 (×2): qty 2, 28d supply, fill #1

## 2024-06-10 ENCOUNTER — Encounter: Payer: 59 | Admitting: Nurse Practitioner

## 2024-06-13 ENCOUNTER — Other Ambulatory Visit (HOSPITAL_COMMUNITY): Payer: Self-pay

## 2024-06-24 ENCOUNTER — Other Ambulatory Visit (HOSPITAL_COMMUNITY): Payer: Self-pay

## 2024-08-08 ENCOUNTER — Other Ambulatory Visit (HOSPITAL_COMMUNITY): Payer: Self-pay

## 2024-08-08 MED ORDER — WEGOVY 1.7 MG/0.75ML ~~LOC~~ SOAJ
1.7000 mg | SUBCUTANEOUS | 1 refills | Status: AC
  Filled 2024-08-08: qty 3, 28d supply, fill #0
  Filled 2024-10-07: qty 3, 28d supply, fill #1

## 2024-08-23 ENCOUNTER — Other Ambulatory Visit (HOSPITAL_COMMUNITY): Payer: Self-pay

## 2024-08-23 MED ORDER — GABAPENTIN 100 MG PO CAPS
100.0000 mg | ORAL_CAPSULE | Freq: Two times a day (BID) | ORAL | 0 refills | Status: AC
  Filled 2024-08-23: qty 60, 30d supply, fill #0

## 2024-08-23 MED ORDER — LISDEXAMFETAMINE DIMESYLATE 30 MG PO CAPS
30.0000 mg | ORAL_CAPSULE | Freq: Every morning | ORAL | 0 refills | Status: DC
  Filled 2024-08-23: qty 30, 30d supply, fill #0

## 2024-08-23 MED ORDER — ONDANSETRON 4 MG PO TBDP
4.0000 mg | ORAL_TABLET | ORAL | 0 refills | Status: AC | PRN
  Filled 2024-08-23: qty 30, 5d supply, fill #0

## 2024-08-28 ENCOUNTER — Other Ambulatory Visit (HOSPITAL_COMMUNITY): Payer: Self-pay

## 2024-08-28 MED ORDER — VENLAFAXINE HCL ER 75 MG PO CP24
75.0000 mg | ORAL_CAPSULE | Freq: Every day | ORAL | 3 refills | Status: AC
  Filled 2024-08-28: qty 30, 30d supply, fill #0
  Filled 2024-10-03 – 2024-10-07 (×2): qty 30, 30d supply, fill #1
  Filled 2024-11-01: qty 30, 30d supply, fill #2
  Filled 2024-12-10: qty 30, 30d supply, fill #3
  Filled 2025-01-08: qty 30, 30d supply, fill #4

## 2024-10-03 ENCOUNTER — Other Ambulatory Visit (HOSPITAL_COMMUNITY): Payer: Self-pay

## 2024-10-03 MED ORDER — LISDEXAMFETAMINE DIMESYLATE 30 MG PO CAPS
30.0000 mg | ORAL_CAPSULE | Freq: Every morning | ORAL | 0 refills | Status: AC
  Filled 2024-10-03: qty 30, 30d supply, fill #0

## 2024-10-04 ENCOUNTER — Other Ambulatory Visit (HOSPITAL_COMMUNITY): Payer: Self-pay

## 2024-10-07 ENCOUNTER — Other Ambulatory Visit (HOSPITAL_COMMUNITY): Payer: Self-pay

## 2024-11-01 ENCOUNTER — Other Ambulatory Visit (HOSPITAL_COMMUNITY): Payer: Self-pay

## 2024-11-04 ENCOUNTER — Other Ambulatory Visit (HOSPITAL_COMMUNITY): Payer: Self-pay

## 2024-11-04 MED ORDER — LISDEXAMFETAMINE DIMESYLATE 30 MG PO CAPS
30.0000 mg | ORAL_CAPSULE | Freq: Every day | ORAL | 0 refills | Status: DC
  Filled 2024-11-04: qty 30, 30d supply, fill #0

## 2024-11-05 ENCOUNTER — Other Ambulatory Visit: Payer: Self-pay

## 2024-11-05 ENCOUNTER — Other Ambulatory Visit (HOSPITAL_COMMUNITY): Payer: Self-pay

## 2024-12-05 ENCOUNTER — Other Ambulatory Visit (HOSPITAL_COMMUNITY): Payer: Self-pay

## 2024-12-05 MED ORDER — WEGOVY 0.25 MG/0.5ML ~~LOC~~ SOAJ
0.2500 mg | SUBCUTANEOUS | 0 refills | Status: AC
  Filled 2024-12-05 – 2024-12-27 (×6): qty 2, 28d supply, fill #0

## 2024-12-05 MED ORDER — LISINOPRIL 5 MG PO TABS
5.0000 mg | ORAL_TABLET | Freq: Every day | ORAL | 0 refills | Status: DC
  Filled 2024-12-05 – 2024-12-13 (×2): qty 30, 30d supply, fill #0

## 2024-12-10 ENCOUNTER — Other Ambulatory Visit (HOSPITAL_COMMUNITY): Payer: Self-pay

## 2024-12-10 ENCOUNTER — Other Ambulatory Visit: Payer: Self-pay

## 2024-12-13 ENCOUNTER — Other Ambulatory Visit (HOSPITAL_COMMUNITY): Payer: Self-pay

## 2024-12-14 ENCOUNTER — Other Ambulatory Visit (HOSPITAL_COMMUNITY): Payer: Self-pay

## 2024-12-15 ENCOUNTER — Other Ambulatory Visit (HOSPITAL_COMMUNITY): Payer: Self-pay

## 2024-12-16 ENCOUNTER — Other Ambulatory Visit (HOSPITAL_COMMUNITY): Payer: Self-pay

## 2024-12-16 MED ORDER — LISDEXAMFETAMINE DIMESYLATE 30 MG PO CAPS
30.0000 mg | ORAL_CAPSULE | Freq: Every day | ORAL | 0 refills | Status: AC
  Filled 2024-12-16: qty 30, 30d supply, fill #0

## 2024-12-16 MED ORDER — AMPHETAMINE-DEXTROAMPHETAMINE 5 MG PO TABS
5.0000 mg | ORAL_TABLET | Freq: Every day | ORAL | 0 refills | Status: AC
  Filled 2024-12-16: qty 30, 30d supply, fill #0

## 2024-12-17 ENCOUNTER — Other Ambulatory Visit (HOSPITAL_COMMUNITY): Payer: Self-pay

## 2024-12-18 ENCOUNTER — Other Ambulatory Visit (HOSPITAL_COMMUNITY): Payer: Self-pay

## 2024-12-25 ENCOUNTER — Other Ambulatory Visit (HOSPITAL_COMMUNITY): Payer: Self-pay

## 2024-12-27 ENCOUNTER — Other Ambulatory Visit (HOSPITAL_COMMUNITY): Payer: Self-pay

## 2024-12-28 ENCOUNTER — Other Ambulatory Visit (HOSPITAL_COMMUNITY): Payer: Self-pay

## 2024-12-30 ENCOUNTER — Other Ambulatory Visit (HOSPITAL_COMMUNITY): Payer: Self-pay

## 2024-12-31 ENCOUNTER — Other Ambulatory Visit (HOSPITAL_COMMUNITY): Payer: Self-pay

## 2025-01-02 ENCOUNTER — Other Ambulatory Visit (HOSPITAL_COMMUNITY): Payer: Self-pay

## 2025-01-08 ENCOUNTER — Other Ambulatory Visit (HOSPITAL_COMMUNITY): Payer: Self-pay

## 2025-01-08 ENCOUNTER — Other Ambulatory Visit: Payer: Self-pay

## 2025-01-09 ENCOUNTER — Other Ambulatory Visit (HOSPITAL_COMMUNITY): Payer: Self-pay

## 2025-01-09 MED ORDER — LISINOPRIL 5 MG PO TABS
5.0000 mg | ORAL_TABLET | Freq: Every day | ORAL | 0 refills | Status: AC
  Filled 2025-01-09: qty 30, 30d supply, fill #0

## 2025-01-11 ENCOUNTER — Other Ambulatory Visit (HOSPITAL_COMMUNITY): Payer: Self-pay

## 2025-01-14 ENCOUNTER — Other Ambulatory Visit (HOSPITAL_COMMUNITY): Payer: Self-pay

## 2025-01-14 MED ORDER — FOLIC ACID 1 MG PO TABS
1.0000 mg | ORAL_TABLET | Freq: Every day | ORAL | 3 refills | Status: AC
  Filled 2025-01-14: qty 30, 30d supply, fill #0
